# Patient Record
Sex: Male | Born: 1985 | Race: Black or African American | Hispanic: No | Marital: Single | Smoking: Current every day smoker
Health system: Southern US, Community
[De-identification: ages and names within clinical notes are randomized; demographics above are authoritative.]

## PROBLEM LIST (undated history)

## (undated) DIAGNOSIS — B009 Herpesviral infection, unspecified: Secondary | ICD-10-CM

## (undated) HISTORY — PX: RHINOPLASTY: SUR1284

---

## 2020-03-05 ENCOUNTER — Ambulatory Visit (INDEPENDENT_AMBULATORY_CARE_PROVIDER_SITE_OTHER): Payer: Self-pay | Admitting: Pharmacist

## 2020-03-05 ENCOUNTER — Telehealth: Payer: Self-pay | Admitting: Pharmacy Technician

## 2020-03-05 ENCOUNTER — Other Ambulatory Visit: Payer: Self-pay

## 2020-03-05 DIAGNOSIS — Z7252 High risk homosexual behavior: Secondary | ICD-10-CM

## 2020-03-05 DIAGNOSIS — Z113 Encounter for screening for infections with a predominantly sexual mode of transmission: Secondary | ICD-10-CM

## 2020-03-05 NOTE — Progress Notes (Signed)
Date:  03/05/2020   HPI: Ian Humphrey is a 34 y.o. male who presents to the [facility] to discuss and initiate PrEP.  Insured   []    Uninsured  [x]    There are no problems to display for this patient.   Patient's Medications   No medications on file    Allergies: Not on File  Past Medical History: No past medical history on file.  Social History: Social History   Socioeconomic History  . Marital status: Single    Spouse name: Not on file  . Number of children: Not on file  . Years of education: Not on file  . Highest education level: Not on file  Occupational History  . Not on file  Tobacco Use  . Smoking status: Not on file  Substance and Sexual Activity  . Alcohol use: Not on file  . Drug use: Not on file  . Sexual activity: Not on file  Other Topics Concern  . Not on file  Social History Narrative  . Not on file   Social Determinants of Health   Financial Resource Strain:   . Difficulty of Paying Living Expenses:   Food Insecurity:   . Worried About Charity fundraiser in the Last Year:   . Arboriculturist in the Last Year:   Transportation Needs:   . Film/video editor (Medical):   Marland Kitchen Lack of Transportation (Non-Medical):   Physical Activity:   . Days of Exercise per Week:   . Minutes of Exercise per Session:   Stress:   . Feeling of Stress :   Social Connections:   . Frequency of Communication with Friends and Family:   . Frequency of Social Gatherings with Friends and Family:   . Attends Religious Services:   . Active Member of Clubs or Organizations:   . Attends Archivist Meetings:   Marland Kitchen Marital Status:     CHL HIV PREP FLOWSHEET RESULTS 03/05/2020  Insurance Status Uninsured  Gender at birth Male  Gender identity cis-Male  Sex Partners Men only  # sex partners past 3-6 mos 1  Sex activity preferences Insertive and receptive  Condom use No  Treated for STI? Yes  HIV symptoms? Flu-like/Mono-like symptoms  PrEP  Eligibility Yes    Labs:  SCr: No results found for: CREATININE HIV No results found for: HIV Hepatitis B No results found for: HEPBSAB, HEPBSAG, HEPBCAB Hepatitis C No results found for: HEPCAB, HCVRNAPCRQN Hepatitis A No results found for: HAV RPR and STI No results found for: LABRPR, RPRTITER  No flowsheet data found.  Assessment: Ian Humphrey presents to clinic this afternoon as a new PrEP patient. He knows PrEP is used to help against contracting the HIV virus, and decided to inquire about it since his husband is HIV positive. He reports they are in a monogamous relationship and he thinks his husband is undetectable the majority of the time, but there are spells where his HIV medication causes him issues and he stops taking the medication. He does not receive care here, [facility] We spoke about U=U and I encouraged him that PrEP would only increase his chances of preventing HIV. Him and his partner do not use protection and he is a versatile partner. They do not have oral sex. He would like to be tested for STDs at this visit.  I told him that there are two medications that we use for PrEP, Descovy  and Truvada. They both are effective at preventing HIV, but we will be prescribing him Descovy. Descovy does not cause as much kidney and bone toxicity as Truvada does, which is why we favor it in men. This is a one pill once a day medication that is generally very well tolerated. He can take it with or without food. Adherence is the most important factor in whether this medication will work or not. He understands this and seems motivated to be adherent.   He did describe some flu-like symptoms about a month ago, and he has had a persistent headache almost everyday. These symptoms are pretty non-specific but sometimes a new HIV infection can present this way. We will check the HIV antibody before starting. We talked about why it is important we check that  lab on follow up visits. Descovy would not be enough to treat an HIV infection and would likely cause him to develop a resistant/harder to treat infection if he were to take this medication with an active HIV infection.   He has no other questions at this time. He is approved for his first 30 days of Descovy which [facility] [address] in [place] if his HIV Ab is negative.     Plan: - BMET, HIV Ab, Hepatitis A,B,C, RPR, Urine/oral/rectal cytologies - Descovy if HIV Ab negative - F/u with Ian Humphrey 04/08/2020  Jettie Pagan, PharmD PGY2 Infectious Disease Pharmacy Resident  [facility] 03/05/2020, 3:41 PM

## 2020-03-05 NOTE — Telephone Encounter (Addendum)
[facility] Patient Advocate Encounter  Completed and sent [employer] Advancing Access application for Descovy for this patient who is uninsured.    Patient is approved 03/05/2020 through 09/20/2020.      Netty Starring. Dimas Aguas CPhT Specialty Pharmacy Patient [facility] Phone: [phone] Fax:  [phone]

## 2020-03-05 NOTE — Telephone Encounter (Signed)
[facility] Patient Advocate Encounter ° °Insurance verification completed.   ° °The patient is uninsured and will need patient assistance for medication. ° °We can complete the application and will need to meet with the patient for signatures and income documentation. ° °Levii Hairfield E. Durene Dodge, CPhT °Specialty Pharmacy Patient Advocate °[facility] °Phone: [phone] °Fax:  [phone] ° ° °

## 2020-03-06 ENCOUNTER — Other Ambulatory Visit: Payer: Self-pay | Admitting: Pharmacist

## 2020-03-06 DIAGNOSIS — Z7252 High risk homosexual behavior: Secondary | ICD-10-CM

## 2020-03-06 LAB — URINE CYTOLOGY ANCILLARY ONLY
Chlamydia: NEGATIVE
Comment: NEGATIVE
Comment: NORMAL
Neisseria Gonorrhea: NEGATIVE

## 2020-03-06 LAB — CYTOLOGY, (ORAL, ANAL, URETHRAL) ANCILLARY ONLY
Chlamydia: NEGATIVE
Comment: NEGATIVE
Comment: NORMAL
Neisseria Gonorrhea: NEGATIVE

## 2020-03-06 LAB — BASIC METABOLIC PANEL
BUN: 10 mg/dL (ref 7–25)
CO2: 29 mmol/L (ref 20–32)
Calcium: 9.8 mg/dL (ref 8.6–10.3)
Chloride: 102 mmol/L (ref 98–110)
Creat: 0.95 mg/dL (ref 0.60–1.35)
Glucose, Bld: 82 mg/dL (ref 65–99)
Potassium: 4.2 mmol/L (ref 3.5–5.3)
Sodium: 138 mmol/L (ref 135–146)

## 2020-03-06 LAB — HEPATITIS A ANTIBODY, TOTAL: Hepatitis A AB,Total: NONREACTIVE

## 2020-03-06 LAB — HEPATITIS C ANTIBODY
Hepatitis C Ab: NONREACTIVE
SIGNAL TO CUT-OFF: 0.01 (ref <1.00)

## 2020-03-06 LAB — HEPATITIS B SURFACE ANTIGEN: Hepatitis B Surface Ag: NONREACTIVE

## 2020-03-06 LAB — HIV ANTIBODY (ROUTINE TESTING W REFLEX): HIV 1&2 Ab, 4th Generation: NONREACTIVE

## 2020-03-06 LAB — RPR: RPR Ser Ql: NONREACTIVE

## 2020-03-06 LAB — HEPATITIS B SURFACE ANTIBODY,QUALITATIVE: Hep B S Ab: REACTIVE — AB

## 2020-03-06 MED ORDER — EMTRICITABINE-TENOFOVIR AF 200-25 MG PO TABS: 1.0000 | ORAL_TABLET | Freq: Every day | ORAL | 0 refills | Status: DC

## 2020-03-06 NOTE — Progress Notes (Unsigned)
Patients HIV Ab was negative, [facility] [address]

## 2020-03-13 NOTE — Addendum Note (Signed)
Addended by: Robinette Haines on: 03/13/2020 10:46 AM   Modules accepted: Level of Service

## 2020-04-08 ENCOUNTER — Ambulatory Visit (INDEPENDENT_AMBULATORY_CARE_PROVIDER_SITE_OTHER): Payer: Self-pay | Admitting: Pharmacist

## 2020-04-08 ENCOUNTER — Other Ambulatory Visit: Payer: Self-pay

## 2020-04-08 DIAGNOSIS — Z79899 Other long term (current) drug therapy: Secondary | ICD-10-CM

## 2020-04-08 DIAGNOSIS — Z7252 High risk homosexual behavior: Secondary | ICD-10-CM

## 2020-04-08 NOTE — Progress Notes (Signed)
Date:  04/08/2020   HPI: Dyshon Philbin is a 34 y.o. male who presents to the [facility] clinic for 3 month PrEP follow-up.  Insured   []    Uninsured  [x]    There are no problems to display for this patient.   Patient's Medications  New Prescriptions   No medications on file  Previous Medications   EMTRICITABINE-TENOFOVIR AF (DESCOVY) 200-25 MG TABLET    Take 1 tablet by mouth daily.  Modified Medications   No medications on file  Discontinued Medications   No medications on file    Allergies: Not on File  Past Medical History: No past medical history on file.  Social History: Social History   Socioeconomic History  . Marital status: Single    Spouse name: Not on file  . Number of children: Not on file  . Years of education: Not on file  . Highest education level: Not on file  Occupational History  . Not on file  Tobacco Use  . Smoking status: Not on file  Substance and Sexual Activity  . Alcohol use: Not on file  . Drug use: Not on file  . Sexual activity: Not on file  Other Topics Concern  . Not on file  Social History Narrative  . Not on file   Social Determinants of Health   Financial Resource Strain:   . Difficulty of Paying Living Expenses:   Food Insecurity:   . Worried About in the Last Year:   . in the Last Year:   Transportation Needs:   . Programme researcher, broadcasting/film/video (Medical):   Barista Lack of Transportation (Non-Medical):   Physical Activity:   . Days of Exercise per Week:   . Minutes of Exercise per Session:   Stress:   . Feeling of Stress :   Social Connections:   . Frequency of Communication with Friends and Family:   . Frequency of Social Gatherings with Friends and Family:   . Attends Religious Services:   . Active Member of Clubs or Organizations:   . Attends Freight forwarder Meetings:   Marland Kitchen Marital Status:     CHL HIV PREP FLOWSHEET RESULTS 03/05/2020  Insurance Status Uninsured  Gender at  birth Male  Gender identity cis-Male  Sex Partners Men only  # sex partners past 3-6 mos 1  Sex activity preferences Insertive and receptive  Condom use No  Treated for STI? Yes  HIV symptoms? Flu-like/Mono-like symptoms  PrEP Eligibility Yes    Labs:  SCr: Lab Results  Component Value Date   CREATININE 0.95 03/05/2020   HIV Lab Results  Component Value Date   HIV NON-REACTIVE 03/05/2020   Hepatitis B Lab Results  Component Value Date   HEPBSAB REACTIVE (A) 03/05/2020   HEPBSAG NON-REACTIVE 03/05/2020   Hepatitis C Lab Results  Component Value Date   HEPCAB NON-REACTIVE 03/05/2020   Hepatitis A Lab Results  Component Value Date   HAV NON-REACTIVE 03/05/2020   RPR and STI Lab Results  Component Value Date   LABRPR NON-REACTIVE 03/05/2020    STI Results GC CT  03/05/2020 Negative Negative  03/05/2020 Negative Negative    Assessment: Jamespaul is here today for his 1 month PrEP follow up appoimtment. He saw 03/07/2020 last month and was initiated on Descovy. His partner is HIV + on medication. He has a few tablets left of his first month of Descovy and is doing well on it so far. He  had some nausea when he first started but it resolved after a couple of days. He has missed no doses.  We got him approved for [employer] patient assistance until the end of the year.  Will check HIV and a BMET today and see him back in 3 months.  Plan: - HIV antibody + BMET today - Descovy x 3 months if HIV negative - F/u in 3 months  Alpheus Stiff L. Reet Scharrer, PharmD, BCIDP, AAHIVP, CPP Clinical Pharmacist Practitioner Infectious Diseases Clinical Pharmacist [facility] 04/08/2020, 4:13 PM

## 2020-04-09 LAB — BASIC METABOLIC PANEL
BUN: 11 mg/dL (ref 7–25)
CO2: 29 mmol/L (ref 20–32)
Calcium: 9.5 mg/dL (ref 8.6–10.3)
Chloride: 103 mmol/L (ref 98–110)
Creat: 1.1 mg/dL (ref 0.60–1.35)
Glucose, Bld: 94 mg/dL (ref 65–99)
Potassium: 4.5 mmol/L (ref 3.5–5.3)
Sodium: 139 mmol/L (ref 135–146)

## 2020-04-09 LAB — HIV ANTIBODY (ROUTINE TESTING W REFLEX): HIV 1&2 Ab, 4th Generation: NONREACTIVE

## 2020-04-10 ENCOUNTER — Other Ambulatory Visit: Payer: Self-pay | Admitting: Pharmacist

## 2020-04-10 ENCOUNTER — Encounter: Payer: Self-pay | Admitting: Pharmacist

## 2020-04-10 DIAGNOSIS — Z7252 High risk homosexual behavior: Secondary | ICD-10-CM

## 2020-04-10 MED ORDER — EMTRICITABINE-TENOFOVIR AF 200-25 MG PO TABS: 1.0000 | ORAL_TABLET | Freq: Every day | ORAL | 2 refills | Status: DC

## 2020-04-10 NOTE — Progress Notes (Signed)
Patient's HIV antibody is negative.  [facility]  

## 2020-07-02 ENCOUNTER — Other Ambulatory Visit: Payer: Self-pay

## 2020-07-02 ENCOUNTER — Ambulatory Visit (INDEPENDENT_AMBULATORY_CARE_PROVIDER_SITE_OTHER): Payer: Self-pay | Admitting: Pharmacist

## 2020-07-02 DIAGNOSIS — Z7252 High risk homosexual behavior: Secondary | ICD-10-CM

## 2020-07-02 NOTE — Progress Notes (Signed)
Date:  07/02/2020   HPI: Ian Humphrey is a 34 y.o. male who presents to the [facility] for HIV PrEP follow-up.  Insured   []    Uninsured  [x]    There are no problems to display for this patient.   Patient's Medications  New Prescriptions   No medications on file  Previous Medications   EMTRICITABINE-TENOFOVIR AF (DESCOVY) 200-25 MG TABLET    Take 1 tablet by mouth daily.  Modified Medications   No medications on file  Discontinued Medications   No medications on file    Allergies: Not on File  Past Medical History: No past medical history on file.  Social History: Social History   Socioeconomic History  . Marital status: Single    Spouse name: Not on file  . Number of children: Not on file  . Years of education: Not on file  . Highest education level: Not on file  Occupational History  . Not on file  Tobacco Use  . Smoking status: Not on file  Substance and Sexual Activity  . Alcohol use: Not on file  . Drug use: Not on file  . Sexual activity: Not on file  Other Topics Concern  . Not on file  Social History Narrative  . Not on file   Social Determinants of Health   Financial Resource Strain:   . Difficulty of Paying Living Expenses: Not on file  Food Insecurity:   . Worried About in the Last Year: Not on file  . Ran Out of Food in the Last Year: Not on file  Transportation Needs:   . Lack of Transportation (Medical): Not on file  . Lack of Transportation (Non-Medical): Not on file  Physical Activity:   . Days of Exercise per Week: Not on file  . Minutes of Exercise per Session: Not on file  Stress:   . Feeling of Stress : Not on file  Social Connections:   . Frequency of Communication with Friends and Family: Not on file  . Frequency of Social Gatherings with Friends and Family: Not on file  . Attends Religious Services: Not on file  . Active Member of Clubs or Organizations: Not on file  . Attends Meetings: Not on file  . Marital Status: Not on file    CHL HIV PREP FLOWSHEET RESULTS 03/05/2020  Insurance Status Uninsured  Gender at birth Male  Gender identity cis-Male  Sex Partners Men only  # sex partners past 3-6 mos 1  Sex activity preferences Insertive and receptive  Condom use No  Treated for STI? Yes  HIV symptoms? Flu-like/Mono-like symptoms  PrEP Eligibility Yes    Labs:  SCr: Lab Results  Component Value Date   CREATININE 1.10 04/08/2020   CREATININE 0.95 03/05/2020   HIV Lab Results  Component Value Date   HIV NON-REACTIVE 04/08/2020   HIV NON-REACTIVE 03/05/2020   Hepatitis B Lab Results  Component Value Date   HEPBSAB REACTIVE (A) 03/05/2020   HEPBSAG NON-REACTIVE 03/05/2020   Hepatitis C Lab Results  Component Value Date   HEPCAB NON-REACTIVE 03/05/2020   Hepatitis A Lab Results  Component Value Date   HAV NON-REACTIVE 03/05/2020   RPR and STI Lab Results  Component Value Date   LABRPR NON-REACTIVE 03/05/2020    STI Results GC CT  03/05/2020 Negative Negative  03/05/2020 Negative Negative    Assessment: Ian Humphrey presents today for his 50-month PrEP follow-up. He is taking Descovy daily (  no missed doses) and is tolerating well. Will check HIV ab today. If negative, will refill his Descovy. Will follow up with him in 3 months. Patient has received his COVID-19 Pfizer shots; he hasn't received his flu shot yet but did not want it today.  He reports being exposed to gonorrhea with a new partner and now has urinary symptoms including burning and pain. He stated this was a "one time thing", and he is still with his long-term partner. He has not been tested for STIs since exposure, so we recommended calling the health department to receive free testing and treatment since he is uninsured. We informed him that we could test him here for free but would have to charge him for his STI treatment at [facility]. He stated he would call the health  department tomorrow morning.  Plan: Check HIV ab If HIV ab negative, refill Descovy Follow-up with Cassie on 10/02/20  Recommend calling/going to health dept for STI testing/treatment  Margarite Gouge, PharmD PGY2 ID Pharmacy Resident [phone]  07/02/2020, 3:15 PM

## 2020-07-03 ENCOUNTER — Other Ambulatory Visit: Payer: Self-pay | Admitting: Pharmacist

## 2020-07-03 DIAGNOSIS — Z7252 High risk homosexual behavior: Secondary | ICD-10-CM

## 2020-07-03 LAB — HIV ANTIBODY (ROUTINE TESTING W REFLEX): HIV 1&2 Ab, 4th Generation: NONREACTIVE

## 2020-07-03 MED ORDER — EMTRICITABINE-TENOFOVIR AF 200-25 MG PO TABS: 1.0000 | ORAL_TABLET | Freq: Every day | ORAL | 2 refills | Status: DC

## 2020-07-03 NOTE — Progress Notes (Signed)
Patient's HIV antibody is negative.  [facility]  

## 2020-07-08 ENCOUNTER — Ambulatory Visit: Payer: Self-pay | Admitting: Pharmacist

## 2020-10-02 ENCOUNTER — Other Ambulatory Visit: Payer: Self-pay

## 2020-10-02 ENCOUNTER — Ambulatory Visit: Payer: Self-pay | Admitting: Pharmacist

## 2020-10-02 DIAGNOSIS — Z7252 High risk homosexual behavior: Secondary | ICD-10-CM

## 2020-10-02 DIAGNOSIS — Z113 Encounter for screening for infections with a predominantly sexual mode of transmission: Secondary | ICD-10-CM

## 2020-10-02 DIAGNOSIS — Z79899 Other long term (current) drug therapy: Secondary | ICD-10-CM

## 2020-10-02 MED ORDER — EMTRICITABINE-TENOFOVIR AF 200-25 MG PO TABS: 1.0000 | ORAL_TABLET | Freq: Every day | ORAL | 2 refills | Status: DC

## 2020-10-02 NOTE — Progress Notes (Signed)
   Date:  10/02/2020   HPI: Ian Humphrey is a 35 y.o. male who presents to the [facility] clinic for HIV PrEP follow-up.  Insured   []    Uninsured  [x]    There are no problems to display for this patient.   Patient's Medications  New Prescriptions   No medications on file  Previous Medications   EMTRICITABINE-TENOFOVIR AF (DESCOVY) 200-25 MG TABLET    Take 1 tablet by mouth daily.  Modified Medications   No medications on file  Discontinued Medications   No medications on file    Allergies: Not on File  Past Medical History: No past medical history on file.  Social History: Social History   Socioeconomic History  . Marital status: Single    Spouse name: Not on file  . Number of children: Not on file  . Years of education: Not on file  . Highest education level: Not on file  Occupational History  . Not on file  Tobacco Use  . Smoking status: Not on file  . Smokeless tobacco: Not on file  Substance and Sexual Activity  . Alcohol use: Not on file  . Drug use: Not on file  . Sexual activity: Not on file  Other Topics Concern  . Not on file  Social History Narrative  . Not on file   Social Determinants of Health   Financial Resource Strain: Not on file  Food Insecurity: Not on file  Transportation Needs: Not on file  Physical Activity: Not on file  Stress: Not on file  Social Connections: Not on file    CHL HIV PREP FLOWSHEET RESULTS 03/05/2020  Insurance Status Uninsured  Gender at birth Male  Gender identity cis-Male  Sex Partners Men only  # sex partners past 3-6 mos 1  Sex activity preferences Insertive and receptive  Condom use No  Treated for STI? Yes  HIV symptoms? Flu-like/Mono-like symptoms  PrEP Eligibility Yes    Labs:  SCr: Lab Results  Component Value Date   CREATININE 1.10 04/08/2020   CREATININE 0.95 03/05/2020   HIV Lab Results  Component Value Date   HIV NON-REACTIVE 07/02/2020   HIV NON-REACTIVE 04/08/2020   HIV  NON-REACTIVE 03/05/2020   Hepatitis B Lab Results  Component Value Date   HEPBSAB REACTIVE (A) 03/05/2020   HEPBSAG NON-REACTIVE 03/05/2020   Hepatitis C Lab Results  Component Value Date   HEPCAB NON-REACTIVE 03/05/2020   Hepatitis A Lab Results  Component Value Date   HAV NON-REACTIVE 03/05/2020   RPR and STI Lab Results  Component Value Date   LABRPR NON-REACTIVE 03/05/2020    STI Results GC CT  03/05/2020 Negative Negative  03/05/2020 Negative Negative    Assessment: Ian Humphrey is here today to follow up for PrEP. He takes Descovy every day and has no issues or missed doses. He was exposed to gonorrhea at his last visit and was treated at urgent care. No signs or symptoms of anything today, but he wishes to get screened again. Will check labs, have him come back in 3 months for labs, and then see me again in July.  Plan: - HIV antibody, BMET, urine cytology today - Descovy x 6 months if HIV negative - F/u for labs 4/7 at 4pm - F/u with me 7/12 at 4pm  Sallie Staron L. Cataldo Cosgriff, PharmD, BCIDP, AAHIVP, CPP Clinical Pharmacist Practitioner Infectious Diseases Clinical Pharmacist [facility] 10/02/2020, 3:59 PM

## 2020-10-03 ENCOUNTER — Encounter: Payer: Self-pay | Admitting: Pharmacist

## 2020-10-03 LAB — BASIC METABOLIC PANEL
BUN: 9 mg/dL (ref 7–25)
CO2: 27 mmol/L (ref 20–32)
Calcium: 9.3 mg/dL (ref 8.6–10.3)
Chloride: 102 mmol/L (ref 98–110)
Creat: 0.98 mg/dL (ref 0.60–1.35)
Glucose, Bld: 90 mg/dL (ref 65–99)
Potassium: 4 mmol/L (ref 3.5–5.3)
Sodium: 139 mmol/L (ref 135–146)

## 2020-10-03 LAB — HIV ANTIBODY (ROUTINE TESTING W REFLEX): HIV 1&2 Ab, 4th Generation: NONREACTIVE

## 2020-10-03 MED ORDER — EMTRICITABINE-TENOFOVIR AF 200-25 MG PO TABS: 1.0000 | ORAL_TABLET | Freq: Every day | ORAL | 5 refills | Status: DC

## 2020-10-04 ENCOUNTER — Other Ambulatory Visit (HOSPITAL_COMMUNITY): Payer: Self-pay | Admitting: Pharmacist

## 2020-10-04 ENCOUNTER — Telehealth: Payer: Self-pay

## 2020-10-04 LAB — URINE CYTOLOGY ANCILLARY ONLY
Chlamydia: NEGATIVE
Comment: NEGATIVE
Comment: NORMAL
Neisseria Gonorrhea: NEGATIVE

## 2020-10-04 NOTE — Telephone Encounter (Signed)
Can you look into this when you have a moment please? Thanks!

## 2020-10-04 NOTE — Telephone Encounter (Signed)
[facility] Patient Advocate Encounter  Completed and sent [employer] Advancing Access application for Descovy for this patient who is uninsured.    Patient assistance phone number for follow up is [phone].   This encounter will be updated until final determination.,   Deziray Nabi, CPhT Specialty Pharmacy Patient Advocate [facility] Phone: [phone] Fax:  [phone]  

## 2020-10-08 ENCOUNTER — Telehealth: Payer: Self-pay

## 2020-10-08 MED FILL — DESCOVY 200-25 MG TABS: 200-25 | 30 days supply | Qty: 30 | Fill #0

## 2020-10-08 NOTE — Telephone Encounter (Signed)
[facility] Patient Advocate Encounter  Completed and sent [employer] Advancing Access application for Descovy for this patient who is uninsured.    Patient is approved 10/08/20 through 10/08/21.  BIN      [id number] PCN    [id number] [id number]    [id number] ID        [id number]   Clearance Coots, CPhT Specialty Pharmacy Patient [facility] Phone: [phone] Fax:  [phone]

## 2020-12-17 ENCOUNTER — Other Ambulatory Visit (HOSPITAL_COMMUNITY): Payer: Self-pay

## 2020-12-26 ENCOUNTER — Other Ambulatory Visit: Payer: Self-pay | Admitting: Pharmacist

## 2020-12-26 ENCOUNTER — Other Ambulatory Visit: Payer: Self-pay

## 2020-12-26 DIAGNOSIS — Z7252 High risk homosexual behavior: Secondary | ICD-10-CM

## 2020-12-27 LAB — HIV ANTIBODY (ROUTINE TESTING W REFLEX): HIV 1&2 Ab, 4th Generation: NONREACTIVE

## 2021-01-02 ENCOUNTER — Other Ambulatory Visit (HOSPITAL_COMMUNITY): Payer: Self-pay

## 2021-01-09 ENCOUNTER — Encounter: Payer: Self-pay | Admitting: Pharmacist

## 2021-03-11 ENCOUNTER — Other Ambulatory Visit (HOSPITAL_COMMUNITY): Payer: Self-pay

## 2021-03-28 ENCOUNTER — Other Ambulatory Visit: Payer: Self-pay

## 2021-03-28 ENCOUNTER — Ambulatory Visit (HOSPITAL_COMMUNITY)
Admission: EM | Admit: 2021-03-28 | Discharge: 2021-03-28 | Disposition: A | Discharge status: Home / Self Care | Payer: Self-pay | Attending: Urgent Care | Admitting: Urgent Care

## 2021-03-28 ENCOUNTER — Ambulatory Visit (INDEPENDENT_AMBULATORY_CARE_PROVIDER_SITE_OTHER): Payer: Self-pay

## 2021-03-28 ENCOUNTER — Encounter (HOSPITAL_COMMUNITY): Payer: Self-pay

## 2021-03-28 DIAGNOSIS — M79642 Pain in left hand: Secondary | ICD-10-CM

## 2021-03-28 DIAGNOSIS — S60222A Contusion of left hand, initial encounter: Secondary | ICD-10-CM

## 2021-03-28 NOTE — ED Triage Notes (Signed)
Pt presents with left hand injury after fall X 7 days ; pt presents with pain & swelling.

## 2021-03-28 NOTE — ED Provider Notes (Signed)
[facility]   MRN: [id number] DOB: 1986/06/19  Subjective:   Ian Humphrey is a 35 y.o. male presenting for 1 week history of persistent left sided hand, thumb pain.  On patient was at a pool, slipped and fell absorbing the impact of the fall on the left side of his hand/wrist.  Has been using a brace/wrap.  No medications.  Has had some swelling.  Denies bruising, bony deformity.  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  No current facility-administered medications for this encounter.  Current Outpatient Medications:    emtricitabine-tenofovir AF (DESCOVY) 200-25 MG tablet, Take 1 tablet by mouth daily., Disp: 30 tablet, Rfl: 5   emtricitabine-tenofovir AF (DESCOVY) 200-25 MG tablet, TAKE 1 TABLET BY MOUTH DAILY, Disp: 30 tablet, Rfl: 0   No Known Allergies  History reviewed. No pertinent past medical history.   History reviewed. No pertinent surgical history.  Family History  Family history unknown: Yes    Social History   Tobacco Use   Smoking status: Every Day    Pack years: 0.00    Types: Cigarettes    ROS   Objective:   Vitals: BP (!) 157/105 (BP Location: Right Arm)   Pulse 65   Temp 98.4 F (36.9 C) (Oral)   Resp 18   SpO2 95%   Physical Exam Constitutional:      General: He is not in acute distress.    Appearance: Normal appearance. He is well-developed and normal weight. He is not ill-appearing, toxic-appearing or diaphoretic.  HENT:     Head: Normocephalic and atraumatic.     Right Ear: External ear normal.     Left Ear: External ear normal.     Nose: Nose normal.     Mouth/Throat:     Pharynx: Oropharynx is clear.  Eyes:     General: No scleral icterus.       Right eye: No discharge.        Left eye: No discharge.     Extraocular Movements: Extraocular movements intact.     Pupils: Pupils are equal, round, and reactive to light.  Cardiovascular:     Rate and Rhythm: Normal rate.  Pulmonary:     Effort:  Pulmonary effort is normal.  Musculoskeletal:     Left hand: Swelling (trace), tenderness and bony tenderness present. No deformity or lacerations. Normal range of motion. Normal strength. Normal sensation. Normal capillary refill.       Hands:     Cervical back: Normal range of motion.  Neurological:     Mental Status: He is alert and oriented to person, place, and time.  Psychiatric:        Mood and Affect: Mood normal.        Behavior: Behavior normal.        Thought Content: Thought content normal.        Judgment: Judgment normal.    DG Hand Complete Left  Result Date: 03/28/2021 CLINICAL DATA:  Left hand pain, injury EXAM: LEFT HAND - COMPLETE 3+ VIEW COMPARISON:  None. FINDINGS: There is no evidence of fracture or dislocation. There is no evidence of arthropathy or other focal bone abnormality. IMPRESSION: No acute osseous abnormality. Electronically Signed   By: Caprice Renshaw   On: 03/28/2021 15:10     Assessment and Plan :   PDMP not reviewed this encounter.  1. Left hand pain   2. Contusion of left hand, initial encounter     Recommended  rice method, used 2 inch Ace wrap across his hand.  Tylenol for pain control given that he is on Descovy for PrEP and does not have HIV. Counseled patient on potential for adverse effects with medications prescribed/recommended today, ER and return-to-clinic precautions discussed, patient verbalized understanding.    Wallis Bamberg, [place] 03/28/21 1546

## 2021-03-28 NOTE — Discharge Instructions (Addendum)
Please just use Tylenol at a dose of 500mg-650mg once every 6 hours as needed for your aches, pains, fevers. Do not use any nonsteroidal anti-inflammatories (NSAIDs) like ibuprofen, Motrin, naproxen, Aleve, etc. which are all available over-the-counter.   

## 2021-04-01 ENCOUNTER — Ambulatory Visit: Payer: Self-pay | Admitting: Pharmacist

## 2021-04-03 ENCOUNTER — Other Ambulatory Visit: Payer: Self-pay

## 2021-04-03 ENCOUNTER — Other Ambulatory Visit (INDEPENDENT_AMBULATORY_CARE_PROVIDER_SITE_OTHER): Payer: Self-pay | Admitting: Pharmacist

## 2021-04-03 DIAGNOSIS — Z113 Encounter for screening for infections with a predominantly sexual mode of transmission: Secondary | ICD-10-CM

## 2021-04-03 DIAGNOSIS — Z79899 Other long term (current) drug therapy: Secondary | ICD-10-CM

## 2021-04-03 DIAGNOSIS — Z7252 High risk homosexual behavior: Secondary | ICD-10-CM

## 2021-04-03 MED ORDER — EMTRICITABINE-TENOFOVIR AF 200-25 MG PO TABS: 1.0000 | ORAL_TABLET | Freq: Every day | ORAL | 0 refills | Status: DC

## 2021-04-03 NOTE — Progress Notes (Signed)
Date:  04/03/2021   HPI: Ian Humphrey is a 35 y.o. male who presents to the [facility] pharmacy clinic for HIV PrEP follow-up.  Insured   []    Uninsured  []    There are no problems to display for this patient.   Patient's Medications  New Prescriptions   No medications on file  Previous Medications   EMTRICITABINE-TENOFOVIR AF (DESCOVY) 200-25 MG TABLET    Take 1 tablet by mouth daily.   EMTRICITABINE-TENOFOVIR AF (DESCOVY) 200-25 MG TABLET    TAKE 1 TABLET BY MOUTH DAILY  Modified Medications   No medications on file  Discontinued Medications   No medications on file    Allergies: No Known Allergies  Past Medical History: No past medical history on file.  Social History: Social History   Socioeconomic History   Marital status: Single    Spouse name: Not on file   Number of children: Not on file   Years of education: Not on file   Highest education level: Not on file  Occupational History   Not on file  Tobacco Use   Smoking status: Every Day    Types: Cigarettes   Smokeless tobacco: Not on file  Substance and Sexual Activity   Alcohol use: Not on file   Drug use: Not on file   Sexual activity: Not on file  Other Topics Concern   Not on file  Social History Narrative   Not on file   Social Determinants of Health   Financial Resource Strain: Not on file  Food Insecurity: Not on file  Transportation Needs: Not on file  Physical Activity: Not on file  Stress: Not on file  Social Connections: Not on file    CHL HIV PREP FLOWSHEET RESULTS 03/05/2020  Insurance Status Uninsured  Gender at birth Male  Gender identity cis-Male  Sex Partners Men only  # sex partners past 3-6 mos 1  Sex activity preferences Insertive and receptive  Condom use No  Treated for STI? Yes  HIV symptoms? Flu-like/Mono-like symptoms  PrEP Eligibility Yes    Labs:  SCr: Lab Results  Component Value Date   CREATININE 0.98 10/02/2020   CREATININE 1.10 04/08/2020   CREATININE  0.95 03/05/2020   HIV Lab Results  Component Value Date   HIV NON-REACTIVE 12/26/2020   HIV NON-REACTIVE 10/02/2020   HIV NON-REACTIVE 07/02/2020   HIV NON-REACTIVE 04/08/2020   HIV NON-REACTIVE 03/05/2020   Hepatitis B Lab Results  Component Value Date   HEPBSAB REACTIVE (A) 03/05/2020   HEPBSAG NON-REACTIVE 03/05/2020   Hepatitis C Lab Results  Component Value Date   HEPCAB NON-REACTIVE 03/05/2020   Hepatitis A Lab Results  Component Value Date   HAV NON-REACTIVE 03/05/2020   RPR and STI Lab Results  Component Value Date   LABRPR NON-REACTIVE 03/05/2020    STI Results GC CT  10/02/2020 Negative Negative  03/05/2020 Negative Negative  03/05/2020 Negative Negative    Assessment: Ian Humphrey is here for HIV PrEP follow up. He continues to take Descovy through patient assistance. He takes it every day without missing doses. He has a few pills left. Screened for acute HIV symptoms such as fatigue, muscle aches, rash, sore throat, lymphadenopathy, headache, night sweats, nausea/vomiting/diarrhea, and fever. Denies any symptoms. Will screen for HIV and STIs today.   He is interested in Apretude but would like to research it a bit before starting. 03/07/2020 had him sign the patient assistance application just in case he decides to proceed. He will send  me a Clinical cytogeneticist message when he decides. Will send in a 30 day supply of Descovy for him to take in the meantime.  Plan: - HIV antibody, urine/rectal STI swabs, RPR, HIV RNA today - Descovy x 30 days if HIV negative - F/u with me regarding Apretude injections  Cheral Cappucci L. Shylyn Younce, PharmD, BCIDP, AAHIVP, CPP Clinical Pharmacist Practitioner Infectious Diseases Clinical Pharmacist [facility] 04/03/2021, 2:41 PM

## 2021-04-04 LAB — CYTOLOGY, (ORAL, ANAL, URETHRAL) ANCILLARY ONLY
Chlamydia: NEGATIVE
Comment: NEGATIVE
Comment: NORMAL
Neisseria Gonorrhea: NEGATIVE

## 2021-04-04 LAB — URINE CYTOLOGY ANCILLARY ONLY
Chlamydia: NEGATIVE
Comment: NEGATIVE
Comment: NORMAL
Neisseria Gonorrhea: NEGATIVE

## 2021-04-07 LAB — HIV-1 RNA QUANT-NO REFLEX-BLD
HIV 1 RNA Quant: NOT DETECTED Copies/mL
HIV-1 RNA Quant, Log: NOT DETECTED Log cps/mL

## 2021-04-07 LAB — HIV ANTIBODY (ROUTINE TESTING W REFLEX): HIV 1&2 Ab, 4th Generation: NONREACTIVE

## 2021-04-07 LAB — RPR: RPR Ser Ql: NONREACTIVE

## 2021-04-21 NOTE — Progress Notes (Signed)
Date:  04/21/2021   HPI: Ian Humphrey is a 35 y.o. male who presents to the [facility] pharmacy clinic for HIV PrEP follow-up.  Insured   []    Uninsured  [x]    There are no problems to display for this patient.   Patient's Medications  New Prescriptions   No medications on file  Previous Medications   No medications on file  Modified Medications   Modified Medication Previous Medication   EMTRICITABINE-TENOFOVIR AF (DESCOVY) 200-25 MG TABLET emtricitabine-tenofovir AF (DESCOVY) 200-25 MG tablet      Take 1 tablet by mouth daily.    Take 1 tablet by mouth daily.  Discontinued Medications   EMTRICITABINE-TENOFOVIR AF (DESCOVY) 200-25 MG TABLET    TAKE 1 TABLET BY MOUTH DAILY    Allergies: No Known Allergies  Past Medical History: No past medical history on file.  Social History: Social History   Socioeconomic History   Marital status: Single    Spouse name: Not on file   Number of children: Not on file   Years of education: Not on file   Highest education level: Not on file  Occupational History   Not on file  Tobacco Use   Smoking status: Every Day    Types: Cigarettes   Smokeless tobacco: Not on file  Substance and Sexual Activity   Alcohol use: Not on file   Drug use: Not on file   Sexual activity: Not on file  Other Topics Concern   Not on file  Social History Narrative   Not on file   Social Determinants of Health   Financial Resource Strain: Not on file  Food Insecurity: Not on file  Transportation Needs: Not on file  Physical Activity: Not on file  Stress: Not on file  Social Connections: Not on file    CHL HIV PREP FLOWSHEET RESULTS 03/05/2020  Insurance Status Uninsured  Gender at birth Male  Gender identity cis-Male  Sex Partners Men only  # sex partners past 3-6 mos 1  Sex activity preferences Insertive and receptive  Condom use No  Treated for STI? Yes  HIV symptoms? Flu-like/Mono-like symptoms  PrEP Eligibility Yes     Labs:  SCr: Lab Results  Component Value Date   CREATININE 0.98 10/02/2020   CREATININE 1.10 04/08/2020   CREATININE 0.95 03/05/2020   HIV Lab Results  Component Value Date   HIV NON-REACTIVE 04/03/2021   HIV NON-REACTIVE 12/26/2020   HIV NON-REACTIVE 10/02/2020   HIV NON-REACTIVE 07/02/2020   HIV NON-REACTIVE 04/08/2020   Hepatitis B Lab Results  Component Value Date   HEPBSAB REACTIVE (A) 03/05/2020   HEPBSAG NON-REACTIVE 03/05/2020   Hepatitis C Lab Results  Component Value Date   HEPCAB NON-REACTIVE 03/05/2020   Hepatitis A Lab Results  Component Value Date   HAV NON-REACTIVE 03/05/2020   RPR and STI Lab Results  Component Value Date   LABRPR NON-REACTIVE 04/03/2021   LABRPR NON-REACTIVE 03/05/2020    STI Results GC CT  04/03/2021 Negative Negative  04/03/2021 Negative Negative  10/02/2020 Negative Negative  03/05/2020 Negative Negative  03/05/2020 Negative Negative    Assessment: Ian Humphrey is here for HIV PrEP follow up. He continues to take Descovy through patient assistance. He takes it every day without missing doses. He has a few pills left. Screened for acute HIV symptoms such as fatigue, muscle aches, rash, sore throat, lymphadenopathy, headache, night sweats, nausea/vomiting/diarrhea, and fever. Denies any symptoms. Will screen for HIV and STIs today.   He is interested  in Apretude but would like to research it a bit before starting. Ian Humphrey had him sign the patient assistance application just in case he decides to proceed. He will send me a mychart message when he decides. Will send in a 30 day supply of Descovy for him to take in the meantime.  Plan: - HIV antibody, urine/rectal STI swabs, RPR, HIV RNA today - Descovy x 30 days if HIV negative - F/u with me regarding Apretude injections  Kadeisha Betsch L. Joshalyn Ancheta, PharmD, BCIDP, AAHIVP, CPP Clinical Pharmacist Practitioner Infectious Diseases Clinical Pharmacist [facility] 04/21/2021, 12:05 PM

## 2021-06-04 ENCOUNTER — Other Ambulatory Visit: Payer: Self-pay

## 2021-06-04 ENCOUNTER — Ambulatory Visit (INDEPENDENT_AMBULATORY_CARE_PROVIDER_SITE_OTHER): Payer: Self-pay | Admitting: Pharmacist

## 2021-06-04 ENCOUNTER — Other Ambulatory Visit (HOSPITAL_COMMUNITY): Admission: RE | Admit: 2021-06-04 | Payer: Self-pay | Source: Ambulatory Visit | Admitting: Infectious Disease

## 2021-06-04 DIAGNOSIS — Z113 Encounter for screening for infections with a predominantly sexual mode of transmission: Secondary | ICD-10-CM

## 2021-06-04 DIAGNOSIS — Z202 Contact with and (suspected) exposure to infections with a predominantly sexual mode of transmission: Secondary | ICD-10-CM

## 2021-06-04 DIAGNOSIS — Z79899 Other long term (current) drug therapy: Secondary | ICD-10-CM

## 2021-06-04 MED ORDER — CEFTRIAXONE SODIUM 500 MG IJ SOLR
500.0000 mg | Freq: Once | INTRAMUSCULAR | Status: AC
  Administered 2021-06-04: 500 mg via INTRAMUSCULAR

## 2021-06-04 NOTE — Progress Notes (Signed)
Date:  06/04/2021   HPI: Ian Humphrey is a 35 y.o. male who presents to the [facility] pharmacy clinic for HIV PrEP follow-up.  Insured   []    Uninsured  [x]    There are no problems to display for this patient.   Patient's Medications  New Prescriptions   No medications on file  Previous Medications   EMTRICITABINE-TENOFOVIR AF (DESCOVY) 200-25 MG TABLET    Take 1 tablet by mouth daily.  Modified Medications   No medications on file  Discontinued Medications   No medications on file    Allergies: No Known Allergies  Past Medical History: No past medical history on file.  Social History: Social History   Socioeconomic History   Marital status: Single    Spouse name: Not on file   Number of children: Not on file   Years of education: Not on file   Highest education level: Not on file  Occupational History   Not on file  Tobacco Use   Smoking status: Every Day    Types: Cigarettes   Smokeless tobacco: Not on file  Substance and Sexual Activity   Alcohol use: Not on file   Drug use: Not on file   Sexual activity: Not on file  Other Topics Concern   Not on file  Social History Narrative   Not on file   Social Determinants of Health   Financial Resource Strain: Not on file  Food Insecurity: Not on file  Transportation Needs: Not on file  Physical Activity: Not on file  Stress: Not on file  Social Connections: Not on file    CHL HIV PREP FLOWSHEET RESULTS 03/05/2020  Insurance Status Uninsured  Gender at birth Male  Gender identity cis-Male  Sex Partners Men only  # sex partners past 3-6 mos 1  Sex activity preferences Insertive and receptive  Condom use No  Treated for STI? Yes  HIV symptoms? Flu-like/Mono-like symptoms  PrEP Eligibility Yes    Labs:  SCr: Lab Results  Component Value Date   CREATININE 0.98 10/02/2020   CREATININE 1.10 04/08/2020   CREATININE 0.95 03/05/2020   HIV Lab Results  Component Value Date   HIV NON-REACTIVE  04/03/2021   HIV NON-REACTIVE 12/26/2020   HIV NON-REACTIVE 10/02/2020   HIV NON-REACTIVE 07/02/2020   HIV NON-REACTIVE 04/08/2020   Hepatitis B Lab Results  Component Value Date   HEPBSAB REACTIVE (A) 03/05/2020   HEPBSAG NON-REACTIVE 03/05/2020   Hepatitis C Lab Results  Component Value Date   HEPCAB NON-REACTIVE 03/05/2020   Hepatitis A Lab Results  Component Value Date   HAV NON-REACTIVE 03/05/2020   RPR and STI Lab Results  Component Value Date   LABRPR NON-REACTIVE 04/03/2021   LABRPR NON-REACTIVE 03/05/2020    STI Results GC CT  04/03/2021 Negative Negative  04/03/2021 Negative Negative  10/02/2020 Negative Negative  03/05/2020 Negative Negative  03/05/2020 Negative Negative    Assessment: Patient is in a monogamous relationship and reports no other sexual partners. Patient's partner came in for treatment of gonorrhea yesterday after testing positive in [facility]. Patient states he does not have any discharge, but does state he has some dysuria. Will treat with ceftriaxone in clinic.   States he has been compliant with Descovy when he has it, but has been having trouble receiving the medication from the pharmacy. Discussed switching his prescription to the [facility] pharmacy and he voiced his consent. He also stated he prefers the tablets in the bottle as opposed to the  Dosepack. We also discussed the use of Apretude, and stated he wishes to continue PrEP therapy with Descovy at this time.   Discussed the monkeypox vaccine with the patient discussing how monkeypox is transmitted, and side effects of the vaccine as well as when he would achieve immunity. He stated he would discuss the vaccine with his partner and consider it in the future.   Plan: Ceftriaxone 500 mg IM x1 for treatment of Gonorrhea (administered during appointment)  F/U with PrEP pharmacy clinic in 3 months (09/03/21)  F/U HIV Ab test, cytologies (urine, rectal, oral), BMET   Descovy daily x 3 months  (pending negative HIV Ab)    Jani Gravel, PharmD PGY-1 Acute Care Resident  06/04/2021 10:15 AM

## 2021-06-05 LAB — URINE CYTOLOGY ANCILLARY ONLY
Chlamydia: NEGATIVE
Comment: NEGATIVE
Comment: NORMAL
Neisseria Gonorrhea: NEGATIVE

## 2021-06-05 LAB — CYTOLOGY, (ORAL, ANAL, URETHRAL) ANCILLARY ONLY
Chlamydia: NEGATIVE
Chlamydia: NEGATIVE
Comment: NEGATIVE
Comment: NEGATIVE
Comment: NORMAL
Comment: NORMAL
Neisseria Gonorrhea: NEGATIVE
Neisseria Gonorrhea: POSITIVE — AB

## 2021-06-05 LAB — BASIC METABOLIC PANEL
BUN: 8 mg/dL (ref 7–25)
CO2: 28 mmol/L (ref 20–32)
Calcium: 9.4 mg/dL (ref 8.6–10.3)
Chloride: 104 mmol/L (ref 98–110)
Creat: 0.96 mg/dL (ref 0.60–1.26)
Glucose, Bld: 84 mg/dL (ref 65–99)
Potassium: 4.2 mmol/L (ref 3.5–5.3)
Sodium: 140 mmol/L (ref 135–146)

## 2021-06-05 LAB — HIV ANTIBODY (ROUTINE TESTING W REFLEX): HIV 1&2 Ab, 4th Generation: NONREACTIVE

## 2021-06-05 LAB — RPR: RPR Ser Ql: NONREACTIVE

## 2021-06-09 ENCOUNTER — Other Ambulatory Visit: Payer: Self-pay | Admitting: Pharmacist

## 2021-06-09 ENCOUNTER — Other Ambulatory Visit (HOSPITAL_COMMUNITY): Payer: Self-pay

## 2021-06-09 ENCOUNTER — Encounter: Payer: Self-pay | Admitting: Pharmacist

## 2021-06-09 DIAGNOSIS — Z7252 High risk homosexual behavior: Secondary | ICD-10-CM

## 2021-06-09 MED ORDER — EMTRICITABINE-TENOFOVIR AF 200-25 MG PO TABS
1.0000 | ORAL_TABLET | Freq: Every day | ORAL | 2 refills | Status: DC
  Filled 2021-06-09 – 2021-06-20 (×4): qty 30, 30d supply, fill #0
  Filled 2021-07-11: qty 30, 30d supply, fill #1
  Filled 2021-08-06: qty 30, 30d supply, fill #2

## 2021-06-10 ENCOUNTER — Other Ambulatory Visit (HOSPITAL_COMMUNITY): Payer: Self-pay

## 2021-06-17 ENCOUNTER — Other Ambulatory Visit (HOSPITAL_COMMUNITY): Payer: Self-pay

## 2021-06-20 ENCOUNTER — Other Ambulatory Visit (HOSPITAL_COMMUNITY): Payer: Self-pay

## 2021-07-11 ENCOUNTER — Other Ambulatory Visit (HOSPITAL_COMMUNITY): Payer: Self-pay

## 2021-07-14 ENCOUNTER — Other Ambulatory Visit (HOSPITAL_COMMUNITY): Payer: Self-pay

## 2021-08-06 ENCOUNTER — Other Ambulatory Visit (HOSPITAL_COMMUNITY): Payer: Self-pay

## 2021-08-07 ENCOUNTER — Other Ambulatory Visit (HOSPITAL_COMMUNITY): Payer: Self-pay

## 2021-08-12 ENCOUNTER — Other Ambulatory Visit (HOSPITAL_COMMUNITY): Payer: Self-pay

## 2021-08-26 ENCOUNTER — Other Ambulatory Visit (HOSPITAL_COMMUNITY): Payer: Self-pay

## 2021-09-03 ENCOUNTER — Ambulatory Visit (INDEPENDENT_AMBULATORY_CARE_PROVIDER_SITE_OTHER): Payer: Self-pay | Admitting: Pharmacist

## 2021-09-03 ENCOUNTER — Other Ambulatory Visit: Payer: Self-pay

## 2021-09-03 ENCOUNTER — Other Ambulatory Visit (HOSPITAL_COMMUNITY): Payer: Self-pay

## 2021-09-03 DIAGNOSIS — Z113 Encounter for screening for infections with a predominantly sexual mode of transmission: Secondary | ICD-10-CM

## 2021-09-03 DIAGNOSIS — Z79899 Other long term (current) drug therapy: Secondary | ICD-10-CM

## 2021-09-03 NOTE — Progress Notes (Signed)
Date:  09/03/2021   HPI: Ian Humphrey is a 35 y.o. male who presents to the [facility] pharmacy clinic for HIV PrEP follow-up.  Insured   []    Uninsured  [x]    There are no problems to display for this patient.   Patient's Medications  New Prescriptions   No medications on file  Previous Medications   EMTRICITABINE-TENOFOVIR AF (DESCOVY) 200-25 MG TABLET    Take 1 tablet by mouth daily.  Modified Medications   No medications on file  Discontinued Medications   No medications on file    Allergies: No Known Allergies  Past Medical History: No past medical history on file.  Social History: Social History   Socioeconomic History   Marital status: Single    Spouse name: Not on file   Number of children: Not on file   Years of education: Not on file   Highest education level: Not on file  Occupational History   Not on file  Tobacco Use   Smoking status: Every Day    Types: Cigarettes   Smokeless tobacco: Not on file  Substance and Sexual Activity   Alcohol use: Not on file   Drug use: Not on file   Sexual activity: Not on file  Other Topics Concern   Not on file  Social History Narrative   Not on file   Social Determinants of Health   Financial Resource Strain: Not on file  Food Insecurity: Not on file  Transportation Needs: Not on file  Physical Activity: Not on file  Stress: Not on file  Social Connections: Not on file    CHL HIV PREP FLOWSHEET RESULTS 03/05/2020  Insurance Status Uninsured  Gender at birth Male  Gender identity cis-Male  Sex Partners Men only  # sex partners past 3-6 mos 1  Sex activity preferences Insertive and receptive  Condom use No  Treated for STI? Yes  HIV symptoms? Flu-like/Mono-like symptoms  PrEP Eligibility Yes    Labs:  SCr: Lab Results  Component Value Date   CREATININE 0.96 06/04/2021   CREATININE 0.98 10/02/2020   CREATININE 1.10 04/08/2020   CREATININE 0.95 03/05/2020   HIV Lab Results  Component Value  Date   HIV NON-REACTIVE 06/04/2021   HIV NON-REACTIVE 04/03/2021   HIV NON-REACTIVE 12/26/2020   HIV NON-REACTIVE 10/02/2020   HIV NON-REACTIVE 07/02/2020   Hepatitis B Lab Results  Component Value Date   HEPBSAB REACTIVE (A) 03/05/2020   HEPBSAG NON-REACTIVE 03/05/2020   Hepatitis C Lab Results  Component Value Date   HEPCAB NON-REACTIVE 03/05/2020   Hepatitis A Lab Results  Component Value Date   HAV NON-REACTIVE 03/05/2020   RPR and STI Lab Results  Component Value Date   LABRPR NON-REACTIVE 06/04/2021   LABRPR NON-REACTIVE 04/03/2021   LABRPR NON-REACTIVE 03/05/2020    STI Results GC CT  06/04/2021 Negative Negative  06/04/2021 Negative Negative  06/04/2021 Positive(A) Negative  04/03/2021 Negative Negative  04/03/2021 Negative Negative  10/02/2020 Negative Negative  03/05/2020 Negative Negative  03/05/2020 Negative Negative    Assessment: Ian Humphrey is here today to follow up for PrEP. He takes Descovy every day and reports no missed doses. He tested positive for rectal gonorrhea at last visit and was treated with ceftriaxone. He would like to test again today to make sure it has resolved. No new partners or exposures. Screened for acute HIV symptoms such as fatigue, muscle aches, rash, sore throat, lymphadenopathy, headache, night sweats, nausea/vomiting/diarrhea, and fever. Denies any symptoms. Will check labs  and see him back in 3 months.   He recently switched over to Ian Humphrey and states that it is working out very well.   Plan: - HIV antibody, RPR, urine/rectal/pharyngeal GC/CT swabs for cytology today - Descovy x 3 months if HIV negative - F/up in March  Ian Humphrey, PharmD, BCIDP, AAHIVP, CPP Clinical Pharmacist Practitioner Infectious Diseases Clinical Pharmacist [facility] 09/03/2021, 10:05 AM

## 2021-09-04 ENCOUNTER — Other Ambulatory Visit: Payer: Self-pay | Admitting: Pharmacist

## 2021-09-04 ENCOUNTER — Other Ambulatory Visit (HOSPITAL_COMMUNITY): Payer: Self-pay

## 2021-09-04 DIAGNOSIS — Z7252 High risk homosexual behavior: Secondary | ICD-10-CM

## 2021-09-04 LAB — CYTOLOGY, (ORAL, ANAL, URETHRAL) ANCILLARY ONLY
Chlamydia: NEGATIVE
Chlamydia: NEGATIVE
Comment: NEGATIVE
Comment: NEGATIVE
Comment: NORMAL
Comment: NORMAL
Neisseria Gonorrhea: NEGATIVE
Neisseria Gonorrhea: NEGATIVE

## 2021-09-04 LAB — RPR: RPR Ser Ql: NONREACTIVE

## 2021-09-04 LAB — URINE CYTOLOGY ANCILLARY ONLY
Chlamydia: NEGATIVE
Comment: NEGATIVE
Comment: NORMAL
Neisseria Gonorrhea: NEGATIVE

## 2021-09-04 LAB — HIV ANTIBODY (ROUTINE TESTING W REFLEX): HIV 1&2 Ab, 4th Generation: NONREACTIVE

## 2021-09-04 MED ORDER — EMTRICITABINE-TENOFOVIR AF 200-25 MG PO TABS
1.0000 | ORAL_TABLET | Freq: Every day | ORAL | 2 refills | Status: DC
  Filled 2021-09-04: qty 30, 30d supply, fill #0
  Filled 2021-09-30: qty 30, 30d supply, fill #1
  Filled 2021-10-27 – 2021-11-13 (×2): qty 30, 30d supply, fill #2

## 2021-09-16 ENCOUNTER — Encounter: Payer: Self-pay | Admitting: Pharmacist

## 2021-09-17 ENCOUNTER — Other Ambulatory Visit (HOSPITAL_COMMUNITY): Payer: Self-pay

## 2021-09-17 MED ORDER — DOXYCYCLINE MONOHYDRATE 100 MG PO TABS
100.0000 mg | ORAL_TABLET | Freq: Two times a day (BID) | ORAL | 0 refills | Status: DC
  Filled 2021-09-17: qty 14, 7d supply, fill #0

## 2021-09-25 ENCOUNTER — Other Ambulatory Visit (HOSPITAL_COMMUNITY): Payer: Self-pay

## 2021-09-30 ENCOUNTER — Other Ambulatory Visit (HOSPITAL_COMMUNITY): Payer: Self-pay

## 2021-10-02 ENCOUNTER — Other Ambulatory Visit (HOSPITAL_COMMUNITY): Payer: Self-pay

## 2021-10-06 ENCOUNTER — Other Ambulatory Visit (HOSPITAL_COMMUNITY): Payer: Self-pay

## 2021-10-27 ENCOUNTER — Other Ambulatory Visit (HOSPITAL_COMMUNITY): Payer: Self-pay

## 2021-10-28 ENCOUNTER — Other Ambulatory Visit (HOSPITAL_COMMUNITY): Payer: Self-pay

## 2021-11-11 ENCOUNTER — Encounter: Payer: Self-pay | Admitting: Pharmacist

## 2021-11-12 ENCOUNTER — Other Ambulatory Visit (HOSPITAL_COMMUNITY): Payer: Self-pay

## 2021-11-13 ENCOUNTER — Telehealth: Payer: Self-pay

## 2021-11-13 ENCOUNTER — Other Ambulatory Visit (HOSPITAL_COMMUNITY): Payer: Self-pay

## 2021-11-13 NOTE — Telephone Encounter (Signed)
[facility] Patient Advocate Encounter  Completed and sent [employer] Advancing Access application for Descovy for this patient who is uninsured.    Patient is approved 11/13/21 through 11/13/22.  BIN      [id number] PCN    [id number] GRP    [id number] ID        [id number]   Clearance Coots, CPhT Specialty Pharmacy Patient [facility] Phone: [phone] Fax:  [phone]

## 2021-12-01 ENCOUNTER — Ambulatory Visit: Payer: Self-pay | Admitting: Pharmacist

## 2021-12-02 ENCOUNTER — Other Ambulatory Visit (HOSPITAL_COMMUNITY): Payer: Self-pay

## 2021-12-04 ENCOUNTER — Other Ambulatory Visit: Payer: Self-pay

## 2021-12-04 ENCOUNTER — Ambulatory Visit (INDEPENDENT_AMBULATORY_CARE_PROVIDER_SITE_OTHER): Payer: Self-pay | Admitting: Pharmacist

## 2021-12-04 DIAGNOSIS — Z79899 Other long term (current) drug therapy: Secondary | ICD-10-CM

## 2021-12-04 NOTE — Progress Notes (Signed)
? ?HPI: Ian Humphrey is a 36 y.o. male who presents to the [facility] pharmacy clinic for HIV PrEP follow-up. ? ?There are no problems to display for this patient. ? ? ?Patient's Medications  ?New Prescriptions  ? No medications on file  ?Previous Medications  ? DOXYCYCLINE (ADOXA) 100 MG TABLET    Take 1 tablet (100 mg total) by mouth 2 (two) times daily for 7 days  ? EMTRICITABINE-TENOFOVIR AF (DESCOVY) 200-25 MG TABLET    Take 1 tablet by mouth daily.  ?Modified Medications  ? No medications on file  ?Discontinued Medications  ? No medications on file  ? ? ?Allergies: ?No Known Allergies ? ?Past Medical History: ?No past medical history on file. ? ?Social History: ?Social History  ? ?Socioeconomic History  ? Marital status: Single  ?  Spouse name: Not on file  ? Number of children: Not on file  ? Years of education: Not on file  ? Highest education level: Not on file  ?Occupational History  ? Not on file  ?Tobacco Use  ? Smoking status: Every Day  ?  Types: Cigarettes  ? Smokeless tobacco: Not on file  ?Substance and Sexual Activity  ? Alcohol use: Not on file  ? Drug use: Not on file  ? Sexual activity: Not on file  ?Other Topics Concern  ? Not on file  ?Social History Narrative  ? Not on file  ? ?Social Determinants of Health  ? ?Financial Resource Strain: Not on file  ?Food Insecurity: Not on file  ?Transportation Needs: Not on file  ?Physical Activity: Not on file  ?Stress: Not on file  ?Social Connections: Not on file  ? ? ?Labs: ?Lab Results  ?Component Value Date  ? HIV1RNAQUANT Not Detected 04/03/2021  ? ? ?RPR and STI ?Lab Results  ?Component Value Date  ? LABRPR NON-REACTIVE 09/03/2021  ? LABRPR NON-REACTIVE 06/04/2021  ? LABRPR NON-REACTIVE 04/03/2021  ? LABRPR NON-REACTIVE 03/05/2020  ? ? ?STI Results GC CT  ?09/03/2021 Negative Negative  ?09/03/2021 Negative Negative  ?09/03/2021 Negative Negative  ?06/04/2021 Negative Negative  ?06/04/2021 Negative Negative  ?06/04/2021 Positive(A) Negative  ?04/03/2021  Negative Negative  ?04/03/2021 Negative Negative  ?10/02/2020 Negative Negative  ?03/05/2020 Negative Negative  ?03/05/2020 Negative Negative  ? ? ?Hepatitis B ?Lab Results  ?Component Value Date  ? HEPBSAB REACTIVE (A) 03/05/2020  ? HEPBSAG NON-REACTIVE 03/05/2020  ? ?Hepatitis C ?Lab Results  ?Component Value Date  ? HEPCAB NON-REACTIVE 03/05/2020  ? ?Hepatitis A ?Lab Results  ?Component Value Date  ? HAV NON-REACTIVE 03/05/2020  ? ?Lipids: ?No results found for: CHOL, TRIG, HDL, CHOLHDL, VLDL, LDLCALC ? ?Current PrEP Regimen: ?Descovy ? ?Assessment: ?Ian Humphrey states he has had no new partners since the last time we saw him. Though he noted that he is worried about Gonorrhea. Denies any discharge, painful urination, or any other symptoms. Back in December he had bleeding from his penis- this has resolved after treatment with ceftriaxone in the ED.  ? ?States he has been compliant with Descovy when he has it, no trouble since switching to [facility] health pharmacy. We also discussed the use of Apretude, and stated he wishes to continue PrEP therapy with Descovy at this time.  ? ?Plan: ?- F/U with PrEP pharmacy clinic in 3 months (6/15)  ?- F/U HIV Ab test, cytologies (urine, rectal, oral)  ?- Descovy daily x 3 months (pending negative HIV Ab)  ?- Call with any issues or questions ? ?Thank you for allowing pharmacy to be  apart of this patient's care ? ?Feliberto Gottron, PharmD Candidate ?

## 2021-12-05 LAB — GC/CHLAMYDIA PROBE, AMP (THROAT)
Chlamydia trachomatis RNA: NOT DETECTED
Neisseria gonorrhoeae RNA: NOT DETECTED

## 2021-12-05 LAB — HIV ANTIBODY (ROUTINE TESTING W REFLEX): HIV 1&2 Ab, 4th Generation: NONREACTIVE

## 2021-12-05 LAB — CT/NG RNA, TMA RECTAL
Chlamydia Trachomatis RNA: NOT DETECTED
Neisseria Gonorrhoeae RNA: NOT DETECTED

## 2021-12-05 LAB — C. TRACHOMATIS/N. GONORRHOEAE RNA
C. trachomatis RNA, TMA: NOT DETECTED
N. gonorrhoeae RNA, TMA: NOT DETECTED

## 2021-12-05 LAB — RPR: RPR Ser Ql: NONREACTIVE

## 2021-12-08 ENCOUNTER — Other Ambulatory Visit (HOSPITAL_COMMUNITY): Payer: Self-pay

## 2021-12-11 ENCOUNTER — Other Ambulatory Visit (HOSPITAL_COMMUNITY): Payer: Self-pay

## 2021-12-11 ENCOUNTER — Other Ambulatory Visit: Payer: Self-pay | Admitting: Pharmacist

## 2021-12-11 ENCOUNTER — Other Ambulatory Visit: Payer: Self-pay

## 2021-12-11 DIAGNOSIS — Z7252 High risk homosexual behavior: Secondary | ICD-10-CM

## 2021-12-11 MED ORDER — EMTRICITABINE-TENOFOVIR AF 200-25 MG PO TABS
1.0000 | ORAL_TABLET | Freq: Every day | ORAL | 2 refills | Status: DC
  Filled 2021-12-11: qty 30, 30d supply, fill #0
  Filled 2022-01-02: qty 30, 30d supply, fill #1
  Filled 2022-01-27: qty 30, 30d supply, fill #2

## 2022-01-02 ENCOUNTER — Other Ambulatory Visit (HOSPITAL_COMMUNITY): Payer: Self-pay

## 2022-01-06 ENCOUNTER — Other Ambulatory Visit (HOSPITAL_COMMUNITY): Payer: Self-pay

## 2022-01-27 ENCOUNTER — Other Ambulatory Visit (HOSPITAL_COMMUNITY): Payer: Self-pay

## 2022-02-04 ENCOUNTER — Other Ambulatory Visit (HOSPITAL_COMMUNITY): Payer: Self-pay

## 2022-02-25 ENCOUNTER — Other Ambulatory Visit (HOSPITAL_COMMUNITY): Payer: Self-pay

## 2022-02-25 ENCOUNTER — Other Ambulatory Visit: Payer: Self-pay | Admitting: Pharmacist

## 2022-02-25 DIAGNOSIS — Z7252 High risk homosexual behavior: Secondary | ICD-10-CM

## 2022-03-05 ENCOUNTER — Other Ambulatory Visit: Payer: Self-pay

## 2022-03-05 ENCOUNTER — Other Ambulatory Visit (HOSPITAL_COMMUNITY): Payer: Self-pay

## 2022-03-05 ENCOUNTER — Ambulatory Visit (INDEPENDENT_AMBULATORY_CARE_PROVIDER_SITE_OTHER): Payer: Self-pay | Admitting: Pharmacist

## 2022-03-05 ENCOUNTER — Other Ambulatory Visit (HOSPITAL_COMMUNITY)
Admission: RE | Admit: 2022-03-05 | Discharge: 2022-03-05 | Disposition: A | Discharge status: Home / Self Care | Payer: Self-pay | Source: Ambulatory Visit | Attending: Infectious Disease | Admitting: Infectious Disease

## 2022-03-05 DIAGNOSIS — Z113 Encounter for screening for infections with a predominantly sexual mode of transmission: Secondary | ICD-10-CM

## 2022-03-05 DIAGNOSIS — Z7251 High risk heterosexual behavior: Secondary | ICD-10-CM

## 2022-03-05 DIAGNOSIS — Z79899 Other long term (current) drug therapy: Secondary | ICD-10-CM | POA: Insufficient documentation

## 2022-03-05 NOTE — Progress Notes (Signed)
Date:  03/05/2022   HPI: Ian Humphrey is a 36 y.o. male who presents to the [facility] pharmacy clinic for HIV PrEP follow-up.  Insured   []    Uninsured  [x]    There are no problems to display for this patient.   Patient's Medications  New Prescriptions   No medications on file  Previous Medications   DOXYCYCLINE (ADOXA) 100 MG TABLET    Take 1 tablet (100 mg total) by mouth 2 (two) times daily for 7 days   EMTRICITABINE-TENOFOVIR AF (DESCOVY) 200-25 MG TABLET    Take 1 tablet by mouth daily.  Modified Medications   No medications on file  Discontinued Medications   No medications on file    Allergies: No Known Allergies  Past Medical History: No past medical history on file.  Social History: Social History   Socioeconomic History   Marital status: Single    Spouse name: Not on file   Number of children: Not on file   Years of education: Not on file   Highest education level: Not on file  Occupational History   Not on file  Tobacco Use   Smoking status: Every Day    Types: Cigarettes   Smokeless tobacco: Not on file  Substance and Sexual Activity   Alcohol use: Not on file   Drug use: Not on file   Sexual activity: Not on file  Other Topics Concern   Not on file  Social History Narrative   Not on file   Social Determinants of Health   Financial Resource Strain: Not on file  Food Insecurity: Not on file  Transportation Needs: Not on file  Physical Activity: Not on file  Stress: Not on file  Social Connections: Not on file       03/05/2020    3:33 PM  CHL HIV PREP FLOWSHEET RESULTS  Insurance Status Uninsured  Gender at birth Male  Gender identity cis-Male  Sex Partners Men only  # sex partners past 3-6 mos 1  Sex activity preferences Insertive and receptive  Condom use No  Treated for STI? Yes  HIV symptoms? Flu-like/Mono-like symptoms  PrEP Eligibility Yes    Labs:  SCr: Lab Results  Component Value Date   CREATININE 0.96 06/04/2021    CREATININE 0.98 10/02/2020   CREATININE 1.10 04/08/2020   CREATININE 0.95 03/05/2020   HIV Lab Results  Component Value Date   HIV NON-REACTIVE 12/04/2021   HIV NON-REACTIVE 09/03/2021   HIV NON-REACTIVE 06/04/2021   HIV NON-REACTIVE 04/03/2021   HIV NON-REACTIVE 12/26/2020   Hepatitis B Lab Results  Component Value Date   HEPBSAB REACTIVE (A) 03/05/2020   HEPBSAG NON-REACTIVE 03/05/2020   Hepatitis C Lab Results  Component Value Date   HEPCAB NON-REACTIVE 03/05/2020   Hepatitis A Lab Results  Component Value Date   HAV NON-REACTIVE 03/05/2020   RPR and STI Lab Results  Component Value Date   LABRPR NON-REACTIVE 12/04/2021   LABRPR NON-REACTIVE 09/03/2021   LABRPR NON-REACTIVE 06/04/2021   LABRPR NON-REACTIVE 04/03/2021   LABRPR NON-REACTIVE 03/05/2020    STI Results GC CT  09/03/2021 10:10 AM Negative    Negative    Negative  Negative    Negative    Negative   06/04/2021 10:18 AM Positive    Negative    Negative  Negative    Negative    Negative   04/03/2021  2:50 PM Negative    Negative  Negative    Negative   10/02/2020  4:04  PM Negative  Negative   03/05/2020  3:45 PM Negative    Negative  Negative    Negative     Assessment: Ian Humphrey is here for PrEP follow up. He continues to do well on Descovy with no issues at all. He gets his refills on time. He does have a HIV positive partner and asked what happens if they miss doses of their medication. Explained U=U and that depending on how many doses they missed, their viral load would probably become detectable thus putting him at risk of HIV. He said there was a mix up at his partner's pharmacy and he missed about 6 days, but he started back immediately when he had his medication in hand. Also discussed that his Descovy would protect him as well. Requesting STI testing today. Will check labs, send in 3 more months of medication if he remains negative, and see him back in September. Will check his kidney  function at next appointment in September as well.   Plan: - HIV antibody, RPR, urine/rectal/pharyngeal GC/CT swabs for cytology today - Descovy x 3 months if HIV negative - F/u with me on 9/12 at 4pm  Skyylar Kopf L. Sonu Kruckenberg, PharmD, BCIDP, AAHIVP, CPP Clinical Pharmacist Practitioner Infectious Diseases Clinical Pharmacist [facility] 03/05/2022, 2:59 PM

## 2022-03-06 ENCOUNTER — Other Ambulatory Visit: Payer: Self-pay | Admitting: Pharmacist

## 2022-03-06 ENCOUNTER — Other Ambulatory Visit (HOSPITAL_COMMUNITY): Payer: Self-pay

## 2022-03-06 DIAGNOSIS — Z7252 High risk homosexual behavior: Secondary | ICD-10-CM

## 2022-03-06 LAB — CYTOLOGY, (ORAL, ANAL, URETHRAL) ANCILLARY ONLY
Chlamydia: NEGATIVE
Chlamydia: NEGATIVE
Comment: NEGATIVE
Comment: NEGATIVE
Comment: NORMAL
Comment: NORMAL
Neisseria Gonorrhea: NEGATIVE
Neisseria Gonorrhea: NEGATIVE

## 2022-03-06 LAB — RPR: RPR Ser Ql: NONREACTIVE

## 2022-03-06 LAB — URINE CYTOLOGY ANCILLARY ONLY
Chlamydia: NEGATIVE
Comment: NEGATIVE
Comment: NORMAL
Neisseria Gonorrhea: NEGATIVE

## 2022-03-06 LAB — HIV ANTIBODY (ROUTINE TESTING W REFLEX): HIV 1&2 Ab, 4th Generation: NONREACTIVE

## 2022-03-06 MED ORDER — EMTRICITABINE-TENOFOVIR AF 200-25 MG PO TABS
1.0000 | ORAL_TABLET | Freq: Every day | ORAL | 2 refills | Status: DC
  Filled 2022-03-06: qty 30, 30d supply, fill #0
  Filled 2022-03-26: qty 30, 30d supply, fill #1
  Filled 2022-04-27: qty 30, 30d supply, fill #2

## 2022-03-26 ENCOUNTER — Other Ambulatory Visit (HOSPITAL_COMMUNITY): Payer: Self-pay

## 2022-03-31 ENCOUNTER — Other Ambulatory Visit (HOSPITAL_COMMUNITY): Payer: Self-pay

## 2022-04-27 ENCOUNTER — Other Ambulatory Visit (HOSPITAL_COMMUNITY): Payer: Self-pay

## 2022-05-05 ENCOUNTER — Other Ambulatory Visit (HOSPITAL_COMMUNITY): Payer: Self-pay

## 2022-05-26 ENCOUNTER — Other Ambulatory Visit (HOSPITAL_COMMUNITY): Payer: Self-pay

## 2022-05-28 ENCOUNTER — Other Ambulatory Visit (HOSPITAL_COMMUNITY): Payer: Self-pay

## 2022-05-29 ENCOUNTER — Other Ambulatory Visit (HOSPITAL_COMMUNITY): Payer: Self-pay

## 2022-06-02 ENCOUNTER — Other Ambulatory Visit: Payer: Self-pay

## 2022-06-02 ENCOUNTER — Ambulatory Visit (INDEPENDENT_AMBULATORY_CARE_PROVIDER_SITE_OTHER): Payer: Self-pay | Admitting: Pharmacist

## 2022-06-02 DIAGNOSIS — Z113 Encounter for screening for infections with a predominantly sexual mode of transmission: Secondary | ICD-10-CM

## 2022-06-02 DIAGNOSIS — Z79899 Other long term (current) drug therapy: Secondary | ICD-10-CM

## 2022-06-02 NOTE — Progress Notes (Signed)
Date:  06/02/2022   HPI: Ian Humphrey is a 36 y.o. male who presents to the [facility] pharmacy clinic for HIV PrEP follow-up.  Insured   []    Uninsured  [x]    There are no problems to display for this patient.   Patient's Medications  New Prescriptions   No medications on file  Previous Medications   DOXYCYCLINE (ADOXA) 100 MG TABLET    Take 1 tablet (100 mg total) by mouth 2 (two) times daily for 7 days   EMTRICITABINE-TENOFOVIR AF (DESCOVY) 200-25 MG TABLET    Take 1 tablet by mouth daily.  Modified Medications   No medications on file  Discontinued Medications   No medications on file    Allergies: No Known Allergies  Past Medical History: No past medical history on file.  Social History: Social History   Socioeconomic History   Marital status: Single    Spouse name: Not on file   Number of children: Not on file   Years of education: Not on file   Highest education level: Not on file  Occupational History   Not on file  Tobacco Use   Smoking status: Every Day    Types: Cigarettes   Smokeless tobacco: Not on file  Substance and Sexual Activity   Alcohol use: Not on file   Drug use: Not on file   Sexual activity: Not on file  Other Topics Concern   Not on file  Social History Narrative   Not on file   Social Determinants of Health   Financial Resource Strain: Not on file  Food Insecurity: Not on file  Transportation Needs: Not on file  Physical Activity: Not on file  Stress: Not on file  Social Connections: Not on file       03/05/2020    3:33 PM  [facility] HIV PREP FLOWSHEET RESULTS  Insurance Status Uninsured  Gender at birth Male  Gender identity cis-Male  Sex Partners Men only  # sex partners past 3-6 mos 1  Sex activity preferences Insertive and receptive  Condom use No  Treated for STI? Yes  HIV symptoms? Flu-like/Mono-like symptoms  PrEP Eligibility Yes    Labs:  SCr: Lab Results  Component Value Date   CREATININE 0.96 06/04/2021    CREATININE 0.98 10/02/2020   CREATININE 1.10 04/08/2020   CREATININE 0.95 03/05/2020   HIV Lab Results  Component Value Date   HIV NON-REACTIVE 03/05/2022   HIV NON-REACTIVE 12/04/2021   HIV NON-REACTIVE 09/03/2021   HIV NON-REACTIVE 06/04/2021   HIV NON-REACTIVE 04/03/2021   Hepatitis B Lab Results  Component Value Date   HEPBSAB REACTIVE (A) 03/05/2020   HEPBSAG NON-REACTIVE 03/05/2020   Hepatitis C Lab Results  Component Value Date   HEPCAB NON-REACTIVE 03/05/2020   Hepatitis A Lab Results  Component Value Date   HAV NON-REACTIVE 03/05/2020   RPR and STI Lab Results  Component Value Date   LABRPR NON-REACTIVE 03/05/2022   LABRPR NON-REACTIVE 12/04/2021   LABRPR NON-REACTIVE 09/03/2021   LABRPR NON-REACTIVE 06/04/2021   LABRPR NON-REACTIVE 04/03/2021    STI Results GC CT  03/05/2022  3:01 PM Negative    Negative    Negative  Negative    Negative    Negative   09/03/2021 10:10 AM Negative    Negative    Negative  Negative    Negative    Negative   06/04/2021 10:18 AM Positive    Negative    Negative  Negative    Negative  Negative   04/03/2021  2:50 PM Negative    Negative  Negative    Negative   10/02/2020  4:04 PM Negative  Negative   03/05/2020  3:45 PM Negative    Negative  Negative    Negative     Assessment: Ian Humphrey presents to [facility] for 55m PrEP follow-up on Descovy. He reports no symptoms consistent with an acute HIV infection except a rash which developed ~ a week and a half ago. He reports 1 new sexual partner and reports his sexual encounter with this individual appeared around the same time the rash developed. He reports he has not missed doses of his Descovy. I counseled symptoms of acute HIV infection typically develop approximately 2 weeks after exposure and asked if he had another reason for his rash. He reported he works outside a lot. Rash was not diffuse and was hardly noticeable on his right arm. Patient was offered condoms during  today's visit and he accepted.   Patient was also informed he was not immune to hepatitis A and that as he was uninsured, we could not offer him the vaccine at no cost. However, did inform him that [facility] could [facility] to [facility] and they could potentially offer him the vaccine at low to no cost.    Plan: - F/U HIV Ab test  - Refill Descovy pending negative HIV Ab test  - F/U urine/oral/rectal cytologies, RPR  - F/U in 69m (09/01/22) with Cassie Kuppelweiser - Call with any questions or concerns   Jani Gravel, PharmD PGY-2 Infectious Diseases Resident  06/02/2022 4:05 PM

## 2022-06-03 ENCOUNTER — Other Ambulatory Visit (HOSPITAL_COMMUNITY): Payer: Self-pay

## 2022-06-03 ENCOUNTER — Other Ambulatory Visit: Payer: Self-pay | Admitting: Pharmacist

## 2022-06-03 DIAGNOSIS — Z7252 High risk homosexual behavior: Secondary | ICD-10-CM

## 2022-06-03 LAB — BASIC METABOLIC PANEL
BUN: 11 mg/dL (ref 7–25)
CO2: 29 mmol/L (ref 20–32)
Calcium: 9.2 mg/dL (ref 8.6–10.3)
Chloride: 103 mmol/L (ref 98–110)
Creat: 1.18 mg/dL (ref 0.60–1.26)
Glucose, Bld: 82 mg/dL (ref 65–99)
Potassium: 4.1 mmol/L (ref 3.5–5.3)
Sodium: 138 mmol/L (ref 135–146)

## 2022-06-03 LAB — HIV ANTIBODY (ROUTINE TESTING W REFLEX): HIV 1&2 Ab, 4th Generation: NONREACTIVE

## 2022-06-03 LAB — RPR: RPR Ser Ql: NONREACTIVE

## 2022-06-03 MED ORDER — EMTRICITABINE-TENOFOVIR AF 200-25 MG PO TABS
1.0000 | ORAL_TABLET | Freq: Every day | ORAL | 2 refills | Status: DC
  Filled 2022-06-03: qty 30, 30d supply, fill #0
  Filled 2022-06-23: qty 30, 30d supply, fill #1
  Filled 2022-07-21: qty 30, 30d supply, fill #2

## 2022-06-04 LAB — CYTOLOGY, (ORAL, ANAL, URETHRAL) ANCILLARY ONLY
Chlamydia: NEGATIVE
Chlamydia: NEGATIVE
Comment: NEGATIVE
Comment: NEGATIVE
Comment: NORMAL
Comment: NORMAL
Neisseria Gonorrhea: NEGATIVE
Neisseria Gonorrhea: NEGATIVE

## 2022-06-04 LAB — URINE CYTOLOGY ANCILLARY ONLY
Chlamydia: NEGATIVE
Comment: NEGATIVE
Comment: NORMAL
Neisseria Gonorrhea: NEGATIVE

## 2022-06-23 ENCOUNTER — Other Ambulatory Visit (HOSPITAL_COMMUNITY): Payer: Self-pay

## 2022-07-02 ENCOUNTER — Other Ambulatory Visit (HOSPITAL_COMMUNITY): Payer: Self-pay

## 2022-07-21 ENCOUNTER — Other Ambulatory Visit (HOSPITAL_COMMUNITY): Payer: Self-pay

## 2022-07-30 ENCOUNTER — Other Ambulatory Visit (HOSPITAL_COMMUNITY): Payer: Self-pay

## 2022-08-08 ENCOUNTER — Encounter (HOSPITAL_COMMUNITY): Payer: Self-pay | Admitting: *Deleted

## 2022-08-08 ENCOUNTER — Ambulatory Visit (HOSPITAL_COMMUNITY)
Admission: EM | Admit: 2022-08-08 | Discharge: 2022-08-08 | Disposition: A | Discharge status: Home / Self Care | Payer: Self-pay | Attending: Physician Assistant | Admitting: Physician Assistant

## 2022-08-08 DIAGNOSIS — R369 Urethral discharge, unspecified: Secondary | ICD-10-CM | POA: Insufficient documentation

## 2022-08-08 DIAGNOSIS — N2889 Other specified disorders of kidney and ureter: Secondary | ICD-10-CM | POA: Insufficient documentation

## 2022-08-08 DIAGNOSIS — R300 Dysuria: Secondary | ICD-10-CM | POA: Insufficient documentation

## 2022-08-08 LAB — POCT URINALYSIS DIPSTICK, ED / UC
Bilirubin Urine: NEGATIVE
Glucose, UA: NEGATIVE mg/dL
Ketones, ur: NEGATIVE mg/dL
Nitrite: NEGATIVE
Protein, ur: 30 mg/dL — AB
Specific Gravity, Urine: 1.025 (ref 1.005–1.030)
Urobilinogen, UA: 0.2 mg/dL (ref 0.0–1.0)
pH: 6.5 (ref 5.0–8.0)

## 2022-08-08 MED ORDER — CEFTRIAXONE SODIUM 500 MG IJ SOLR
INTRAMUSCULAR | Status: AC
  Filled 2022-08-08: qty 500

## 2022-08-08 MED ORDER — CEFTRIAXONE SODIUM 500 MG IJ SOLR
500.0000 mg | INTRAMUSCULAR | Status: DC
  Administered 2022-08-08: 500 mg via INTRAMUSCULAR

## 2022-08-08 MED ORDER — DOXYCYCLINE HYCLATE 100 MG PO CAPS: 100.0000 mg | ORAL_CAPSULE | Freq: Two times a day (BID) | ORAL | 0 refills | Status: DC

## 2022-08-08 NOTE — ED Provider Notes (Signed)
[facility]    CSN: [id number] Arrival date & time: 08/08/22  1434      History   Chief Complaint Chief Complaint  Patient presents with   Penile Discharge   Dysuria    HPI Ian Humphrey is a 36 y.o. male.   Patient presents today with a 24-hour history of dysuria and penile discharge.  He denies any genital lesions/penile ulcers, abdominal pain, fever, nausea, vomiting.  He is sexually active with male partners and does not always use condoms.  He is on PrEP and followed closely by infectious disease.  He declines HIV and syphilis testing as this is not routinely by his other providers.  He denies any history of recurrent UTI or nephrolithiasis.  Denies any recent antibiotics in the past 90 days.  He has tried Azo without improvement of symptoms.  No additional complaints or concerns today.    History reviewed. No pertinent past medical history.  There are no problems to display for this patient.   Past Surgical History:  Procedure Laterality Date   RHINOPLASTY         Home Medications    Prior to Admission medications   Medication Sig Start Date End Date Taking? Authorizing Provider  doxycycline (VIBRAMYCIN) 100 MG capsule Take 1 capsule (100 mg total) by mouth 2 (two) times daily. 08/08/22  Yes Zidan Helget K, PA-C  emtricitabine-tenofovir AF (DESCOVY) 200-25 MG tablet Take 1 tablet by mouth daily. 06/03/22  Yes Kuppelweiser, Cassie L, RPH-CPP    Family History Family History  Family history unknown: Yes    Social History Social History   Tobacco Use   Smoking status: Some Days    Types: Cigarettes, Pipe   Tobacco comments:    Stopped smoking cigarettes; smokes pipe occasionally  Vaping Use   Vaping Use: Never used  Substance Use Topics   Alcohol use: Yes    Comment: socially on weekends   Drug use: Not Currently    Types: Marijuana     Allergies   Patient has no known allergies.   Review of Systems Review of Systems   Constitutional:  Negative for activity change, appetite change, fatigue and fever.  Gastrointestinal:  Negative for abdominal pain, diarrhea, nausea and vomiting.  Genitourinary:  Positive for dysuria and penile discharge. Negative for frequency, genital sores, penile pain, penile swelling, testicular pain and urgency.  Musculoskeletal:  Negative for arthralgias, back pain and myalgias.     Physical Exam Triage Vital Signs ED Triage Vitals  Enc Vitals Group     BP 08/08/22 1623 (!) 151/95     Pulse Rate 08/08/22 1623 61     Resp 08/08/22 1623 18     Temp 08/08/22 1623 98.1 F (36.7 C)     Temp Source 08/08/22 1623 Oral     SpO2 08/08/22 1623 97 %     Weight --      Height --      Head Circumference --      Peak Flow --      Pain Score 08/08/22 1624 0     Pain Loc --      Pain Edu? --      Excl. in GC? --    No data found.  Updated Vital Signs BP (!) 151/95   Pulse 61   Temp 98.1 F (36.7 C) (Oral)   Resp 18   SpO2 97%   Visual Acuity Right Eye Distance:   Left Eye Distance:   Bilateral Distance:  Right Eye Near:   Left Eye Near:    Bilateral Near:     Physical Exam Vitals reviewed.  Constitutional:      General: He is awake.     Appearance: Normal appearance. He is well-developed. He is not ill-appearing.     Comments: Very pleasant male appears stated age in no acute distress sitting comfortably in exam room  HENT:     Head: Normocephalic and atraumatic.  Cardiovascular:     Rate and Rhythm: Normal rate and regular rhythm.     Heart sounds: Normal heart sounds, S1 normal and S2 normal. No murmur heard. Pulmonary:     Effort: Pulmonary effort is normal.     Breath sounds: Normal breath sounds. No stridor. No wheezing, rhonchi or rales.     Comments: Clear to auscultation bilaterally Abdominal:     General: Bowel sounds are normal.     Palpations: Abdomen is soft.     Tenderness: There is no abdominal tenderness. There is no right CVA tenderness,  left CVA tenderness, guarding or rebound.     Comments: Benign abdominal exam  Genitourinary:    Comments: Exam deferred Neurological:     Mental Status: He is alert.  Psychiatric:        Behavior: Behavior is cooperative.      UC Treatments / Results  Labs (all labs ordered are listed, but only abnormal results are displayed) Labs Reviewed  POCT URINALYSIS DIPSTICK, ED / UC - Abnormal; Notable for the following components:      Result Value   Hgb urine dipstick SMALL (*)    Protein, ur 30 (*)    Leukocytes,Ua MODERATE (*)    All other components within normal limits  URINE CULTURE  CYTOLOGY, (ORAL, ANAL, URETHRAL) ANCILLARY ONLY    EKG   Radiology No results found.  Procedures Procedures (including critical care time)  Medications Ordered in UC Medications  cefTRIAXone (ROCEPHIN) injection 500 mg (has no administration in time range)    Initial Impression / Assessment and Plan / UC Course  I have reviewed the triage vital signs and the nursing notes.  Pertinent labs & imaging results that were available during my care of the patient were reviewed by me and considered in my medical decision making (see chart for details).     UA was obtained that showed moderate leukocyte esterase.  Suspect this is related to urethritis, however, given associated dysuria we will send this for culture to rule out UTI.  Will defer additional antibiotics at this time.  He was given 500 mg of Rocephin in clinic and started on doxycycline 100 mg twice daily for 7 days to cover for gonorrhea/chlamydia for urethritis.  STI swab was collected today.  He declined HIV and syphilis testing.  He was instructed to abstain from sex until 7 days after completing course of treatment (a minimum of 2 weeks).  We discussed the importance of safe sex practices.  If he develops any additional or changing symptoms he is to return for reevaluation.  Strict return precautions given.  Final Clinical  Impressions(s) / UC Diagnoses   Final diagnoses:  Ureteritis  Penile discharge  Dysuria     Discharge Instructions      Your urine did have some white blood cells in it.  We will send this off for culture and contact you if we need to change your antibiotics.  We are treating you for urethritis.  Take doxycycline as prescribed.  You were given  an injection of Rocephin in clinic.  This will cover for gonorrhea and chlamydia.  Please abstain from sex until 7 days after completing treatment (minimum of 2 weeks).  You should use condoms with each sexual encounter.  If you have any additional symptoms or if anything changes you need to be reevaluated.    ED Prescriptions     Medication Sig Dispense Auth. Provider   doxycycline (VIBRAMYCIN) 100 MG capsule Take 1 capsule (100 mg total) by mouth 2 (two) times daily. 14 capsule Shanekia Latella K, PA-C      PDMP not reviewed this encounter.   Jeani Hawking, PA-C 08/08/22 1659

## 2022-08-08 NOTE — ED Triage Notes (Signed)
C/O penile discharge and dysuria onset yesterday.

## 2022-08-08 NOTE — Discharge Instructions (Signed)
Your urine did have some white blood cells in it.  We will send this off for culture and contact you if we need to change your antibiotics.  We are treating you for urethritis.  Take doxycycline as prescribed.  You were given an injection of Rocephin in clinic.  This will cover for gonorrhea and chlamydia.  Please abstain from sex until 7 days after completing treatment (minimum of 2 weeks).  You should use condoms with each sexual encounter.  If you have any additional symptoms or if anything changes you need to be reevaluated.

## 2022-08-09 LAB — URINE CULTURE: Culture: NO GROWTH

## 2022-08-11 LAB — CYTOLOGY, (ORAL, ANAL, URETHRAL) ANCILLARY ONLY
Chlamydia: NEGATIVE
Comment: NEGATIVE
Comment: NEGATIVE
Comment: NORMAL
Neisseria Gonorrhea: POSITIVE — AB
Trichomonas: NEGATIVE

## 2022-08-20 ENCOUNTER — Other Ambulatory Visit (HOSPITAL_COMMUNITY): Payer: Self-pay

## 2022-09-01 ENCOUNTER — Ambulatory Visit: Payer: Self-pay | Admitting: Pharmacist

## 2022-09-03 ENCOUNTER — Other Ambulatory Visit (HOSPITAL_COMMUNITY): Payer: Self-pay

## 2022-09-03 ENCOUNTER — Ambulatory Visit (INDEPENDENT_AMBULATORY_CARE_PROVIDER_SITE_OTHER): Payer: Self-pay | Admitting: Pharmacist

## 2022-09-03 ENCOUNTER — Other Ambulatory Visit (HOSPITAL_COMMUNITY)
Admission: RE | Admit: 2022-09-03 | Discharge: 2022-09-03 | Disposition: A | Discharge status: Home / Self Care | Payer: Self-pay | Source: Ambulatory Visit | Attending: Infectious Disease | Admitting: Infectious Disease

## 2022-09-03 ENCOUNTER — Other Ambulatory Visit: Payer: Self-pay

## 2022-09-03 DIAGNOSIS — Z113 Encounter for screening for infections with a predominantly sexual mode of transmission: Secondary | ICD-10-CM | POA: Insufficient documentation

## 2022-09-03 DIAGNOSIS — Z79899 Other long term (current) drug therapy: Secondary | ICD-10-CM

## 2022-09-03 NOTE — Progress Notes (Signed)
Date:  09/03/2022   HPI: Ian Humphrey is a 36 y.o. male who presents to the [facility] pharmacy clinic for HIV PrEP follow-up.  Insured   []    Uninsured  [x]    There are no problems to display for this patient.   Patient's Medications  New Prescriptions   No medications on file  Previous Medications   DOXYCYCLINE (VIBRAMYCIN) 100 MG CAPSULE    Take 1 capsule (100 mg total) by mouth 2 (two) times daily.   EMTRICITABINE-TENOFOVIR AF (DESCOVY) 200-25 MG TABLET    Take 1 tablet by mouth daily.  Modified Medications   No medications on file  Discontinued Medications   No medications on file    Allergies: No Known Allergies  Past Medical History: No past medical history on file.  Social History: Social History   Socioeconomic History   Marital status: Single    Spouse name: Not on file   Number of children: Not on file   Years of education: Not on file   Highest education level: Not on file  Occupational History   Not on file  Tobacco Use   Smoking status: Some Days    Types: Cigarettes, Pipe   Smokeless tobacco: Not on file   Tobacco comments:    Stopped smoking cigarettes; smokes pipe occasionally  Vaping Use   Vaping Use: Never used  Substance and Sexual Activity   Alcohol use: Yes    Comment: socially on weekends   Drug use: Not Currently    Types: Marijuana   Sexual activity: Yes  Other Topics Concern   Not on file  Social History Narrative   Not on file   Social Determinants of Health   Financial Resource Strain: Not on file  Food Insecurity: Not on file  Transportation Needs: Not on file  Physical Activity: Not on file  Stress: Not on file  Social Connections: Not on file       03/05/2020    3:33 PM  [facility] HIV PREP FLOWSHEET RESULTS  Insurance Status Uninsured  Gender at birth Male  Gender identity cis-Male  Sex Partners Men only  # sex partners past 3-6 mos 1  Sex activity preferences Insertive and receptive  Condom use No  Treated for STI?  Yes  HIV symptoms? Flu-like/Mono-like symptoms  PrEP Eligibility Yes    Labs:  SCr: Lab Results  Component Value Date   CREATININE 1.18 06/02/2022   CREATININE 0.96 06/04/2021   CREATININE 0.98 10/02/2020   CREATININE 1.10 04/08/2020   CREATININE 0.95 03/05/2020   HIV Lab Results  Component Value Date   HIV NON-REACTIVE 06/02/2022   HIV NON-REACTIVE 03/05/2022   HIV NON-REACTIVE 12/04/2021   HIV NON-REACTIVE 09/03/2021   HIV NON-REACTIVE 06/04/2021   Hepatitis B Lab Results  Component Value Date   HEPBSAB REACTIVE (A) 03/05/2020   HEPBSAG NON-REACTIVE 03/05/2020   Hepatitis C Lab Results  Component Value Date   HEPCAB NON-REACTIVE 03/05/2020   Hepatitis A Lab Results  Component Value Date   HAV NON-REACTIVE 03/05/2020   RPR and STI Lab Results  Component Value Date   LABRPR NON-REACTIVE 06/02/2022   LABRPR NON-REACTIVE 03/05/2022   LABRPR NON-REACTIVE 12/04/2021   LABRPR NON-REACTIVE 09/03/2021   LABRPR NON-REACTIVE 06/04/2021    STI Results GC CT  08/08/2022  4:47 PM Positive  Negative   06/02/2022  4:12 PM Negative    Negative    Negative  Negative    Negative    Negative   03/05/2022  3:01 PM Negative    Negative    Negative  Negative    Negative    Negative   09/03/2021 10:10 AM Negative    Negative    Negative  Negative    Negative    Negative   06/04/2021 10:18 AM Positive    Negative    Negative  Negative    Negative    Negative   04/03/2021  2:50 PM Negative    Negative  Negative    Negative   10/02/2020  4:04 PM Negative  Negative   03/05/2020  3:45 PM Negative    Negative  Negative    Negative     Assessment: Ian Humphrey is here today to follow up for HIV PrEP. He is doing well on Descovy and having no issues with taking or tolerating it. Screened for acute HIV symptoms such as fatigue, muscle aches, rash, sore throat, lymphadenopathy, headache, night sweats, nausea/vomiting/diarrhea, and fever. Denies any symptoms. Will check  for HIV and STIs today and see him back in 3 month.   Plan: - HIV antibody, RPR, urine/rectal/pharyngeal GC/CT swabs for cytology today - Descovy x 3 months if HIV negative - F/u with me again on 11/30/22  Constantin Hillery L. Eber Hong, PharmD, BCIDP, AAHIVP, CPP Clinical Pharmacist Practitioner Infectious Diseases [place] [facility] 09/03/2022, 11:40 AM

## 2022-09-04 LAB — CYTOLOGY, (ORAL, ANAL, URETHRAL) ANCILLARY ONLY
Chlamydia: NEGATIVE
Chlamydia: NEGATIVE
Comment: NEGATIVE
Comment: NEGATIVE
Comment: NORMAL
Comment: NORMAL
Neisseria Gonorrhea: NEGATIVE
Neisseria Gonorrhea: NEGATIVE

## 2022-09-04 LAB — URINE CYTOLOGY ANCILLARY ONLY
Chlamydia: NEGATIVE
Comment: NEGATIVE
Comment: NORMAL
Neisseria Gonorrhea: NEGATIVE

## 2022-09-04 LAB — HIV ANTIBODY (ROUTINE TESTING W REFLEX): HIV 1&2 Ab, 4th Generation: NONREACTIVE

## 2022-09-04 LAB — RPR: RPR Ser Ql: NONREACTIVE

## 2022-09-07 ENCOUNTER — Other Ambulatory Visit: Payer: Self-pay

## 2022-09-07 ENCOUNTER — Other Ambulatory Visit (HOSPITAL_COMMUNITY): Payer: Self-pay

## 2022-09-07 ENCOUNTER — Other Ambulatory Visit: Payer: Self-pay | Admitting: Pharmacist

## 2022-09-07 DIAGNOSIS — Z7252 High risk homosexual behavior: Secondary | ICD-10-CM

## 2022-09-07 MED ORDER — EMTRICITABINE-TENOFOVIR AF 200-25 MG PO TABS
1.0000 | ORAL_TABLET | Freq: Every day | ORAL | 2 refills | Status: DC
  Filled 2022-09-07: qty 30, 30d supply, fill #0
  Filled 2022-09-25: qty 30, 30d supply, fill #1
  Filled 2022-10-20: qty 30, 30d supply, fill #2

## 2022-09-25 ENCOUNTER — Other Ambulatory Visit (HOSPITAL_COMMUNITY): Payer: Self-pay

## 2022-10-01 ENCOUNTER — Other Ambulatory Visit (HOSPITAL_COMMUNITY): Payer: Self-pay

## 2022-10-09 ENCOUNTER — Ambulatory Visit
Admission: EM | Admit: 2022-10-09 | Discharge: 2022-10-09 | Disposition: A | Discharge status: Home / Self Care | Payer: Self-pay | Attending: Urgent Care | Admitting: Urgent Care

## 2022-10-09 DIAGNOSIS — Z202 Contact with and (suspected) exposure to infections with a predominantly sexual mode of transmission: Secondary | ICD-10-CM | POA: Insufficient documentation

## 2022-10-09 DIAGNOSIS — R369 Urethral discharge, unspecified: Secondary | ICD-10-CM | POA: Insufficient documentation

## 2022-10-09 DIAGNOSIS — Z7251 High risk heterosexual behavior: Secondary | ICD-10-CM | POA: Insufficient documentation

## 2022-10-09 HISTORY — DX: Herpesviral infection, unspecified: B00.9

## 2022-10-09 MED ORDER — CEFTRIAXONE SODIUM 500 MG IJ SOLR
500.0000 mg | INTRAMUSCULAR | Status: DC
  Administered 2022-10-09: 500 mg via INTRAMUSCULAR

## 2022-10-09 MED ORDER — DOXYCYCLINE HYCLATE 100 MG PO CAPS: 100.0000 mg | ORAL_CAPSULE | Freq: Two times a day (BID) | ORAL | 0 refills | Status: AC

## 2022-10-09 NOTE — ED Provider Notes (Signed)
[facility]  Note:  This document was prepared using Systems analyst and may include unintentional dictation errors.  MRN: [id number] DOB: August 22, 1986  Subjective:   Ian Humphrey is a 37 y.o. male presenting for 5 day history of persistent penile discharge, dysuria. Patient is on Descovy. Has unprotected sex, male partners, has 1 partner. They called him today and advised he get tested today. There is a possible exposure but he does not know which it is. He is Descovy.   No current facility-administered medications for this encounter.  Current Outpatient Medications:    emtricitabine-tenofovir AF (DESCOVY) 200-25 MG tablet, Take 1 tablet by mouth daily., Disp: 30 tablet, Rfl: 2   No Known Allergies  No past medical history on file.   Past Surgical History:  Procedure Laterality Date   RHINOPLASTY      Family History  Family history unknown: Yes    Social History   Tobacco Use   Smoking status: Some Days    Types: Cigarettes, Pipe   Tobacco comments:    Stopped smoking cigarettes; smokes pipe occasionally  Vaping Use   Vaping Use: Never used  Substance Use Topics   Alcohol use: Yes    Comment: socially on weekends   Drug use: Not Currently    Types: Marijuana    ROS   Objective:   Vitals: BP (!) 145/97 (BP Location: Right Arm)   Pulse 80   Temp 98.1 F (36.7 C) (Oral)   Resp 16   SpO2 95%   Physical Exam Constitutional:      General: He is not in acute distress.    Appearance: Normal appearance. He is well-developed and normal weight. He is not ill-appearing, toxic-appearing or diaphoretic.  HENT:     Head: Normocephalic and atraumatic.     Right Ear: External ear normal.     Left Ear: External ear normal.     Nose: Nose normal.     Mouth/Throat:     Pharynx: Oropharynx is clear.  Eyes:     General: No scleral icterus.       Right eye: No discharge.        Left eye: No discharge.     Extraocular  Movements: Extraocular movements intact.  Cardiovascular:     Rate and Rhythm: Normal rate.  Pulmonary:     Effort: Pulmonary effort is normal.  Genitourinary:    Penis: Circumcised. Discharge present. No phimosis, paraphimosis, hypospadias, erythema, tenderness, swelling or lesions.   Musculoskeletal:     Cervical back: Normal range of motion.  Neurological:     Mental Status: He is alert and oriented to person, place, and time.  Psychiatric:        Mood and Affect: Mood normal.        Behavior: Behavior normal.        Thought Content: Thought content normal.        Judgment: Judgment normal.    IM ceftriaxone 500mg  in clinic.   Assessment and Plan :   PDMP not reviewed this encounter.  1. Unprotected sex   2. Possible exposure to STD   3. Penile discharge     Patient treated empirically as per CDC guidelines with IM ceftriaxone, doxycycline as an outpatient.  Labs pending.   Counseled on safe sex practices including abstaining for 1 week following treatment.  Counseled patient on potential for adverse effects with medications prescribed/recommended today, ER and return-to-clinic precautions discussed, patient verbalized understanding.  Jaynee Eagles, PA-C 10/09/22 1505

## 2022-10-09 NOTE — Discharge Instructions (Signed)
Avoid all forms of sexual intercourse (oral, vaginal, anal) for the next 7 days to avoid spreading/reinfecting or at least until we can see what kinds of infection results are positive.  Abstaining for 2 weeks would be better but at least 1 week is required.  We will let you know about your test results from the swab we did today and if you need any prescriptions for antibiotics or changes to your treatment from today.  

## 2022-10-09 NOTE — ED Triage Notes (Signed)
Pt c/o penile d/x x 5 days with +STD exposure-NAD-steady gait

## 2022-10-10 LAB — HIV ANTIBODY (ROUTINE TESTING W REFLEX): HIV Screen 4th Generation wRfx: NONREACTIVE

## 2022-10-10 LAB — RPR: RPR Ser Ql: NONREACTIVE

## 2022-10-15 LAB — CYTOLOGY, (ORAL, ANAL, URETHRAL) ANCILLARY ONLY
Chlamydia: NEGATIVE
Comment: NEGATIVE
Comment: NEGATIVE
Comment: NORMAL
Neisseria Gonorrhea: POSITIVE — AB
Trichomonas: NEGATIVE

## 2022-10-20 ENCOUNTER — Other Ambulatory Visit (HOSPITAL_COMMUNITY): Payer: Self-pay

## 2022-10-22 ENCOUNTER — Ambulatory Visit
Admission: EM | Admit: 2022-10-22 | Discharge: 2022-10-22 | Disposition: A | Discharge status: Home / Self Care | Payer: Self-pay | Attending: Nurse Practitioner | Admitting: Nurse Practitioner

## 2022-10-22 ENCOUNTER — Ambulatory Visit: Admit: 2022-10-22 | Payer: Self-pay

## 2022-10-22 DIAGNOSIS — J069 Acute upper respiratory infection, unspecified: Secondary | ICD-10-CM

## 2022-10-22 DIAGNOSIS — Z113 Encounter for screening for infections with a predominantly sexual mode of transmission: Secondary | ICD-10-CM | POA: Insufficient documentation

## 2022-10-22 DIAGNOSIS — R051 Acute cough: Secondary | ICD-10-CM

## 2022-10-22 DIAGNOSIS — U071 COVID-19: Secondary | ICD-10-CM | POA: Insufficient documentation

## 2022-10-22 NOTE — ED Triage Notes (Signed)
P c/o diarrhea, headache, congestion, sore throat and dizziness.   The patient states he was recently treated for gonorrhea. Patient states he has penile discomfort with urination, he denies having any penile discharge.   Started: 2 days ago   Home interventions: robitussin

## 2022-10-22 NOTE — ED Provider Notes (Addendum)
[facility]    CSN: [id number] Arrival date & time: 10/22/22  1203      History   Chief Complaint Chief Complaint  Patient presents with   Sore Throat   Nasal Congestion   Diarrhea   Headache   Dizziness    HPI Ian Humphrey is a 37 y.o. male  who presents for evaluation of URI symptoms for 2 days. Patient reports associated symptoms of cough, congestion, sore throat, headache, diarrhea, occasional dizziness. Denies N/V, fevers, chills, ear pain, body aches, shortness of breath. Patient does not have a hx of asthma.e is an active smoker.  No known sick contacts and no recent travel. Pt has taken Robitussin OTC for symptoms.  Patient was treated for positive gonorrhea on 1/19.  States all of the symptoms have resolved.  He had negative chlamydia testing at that time.  He states his symptoms have resolved.  He reports his partner is being treated with azithromycin but he is not sure for what it is concerned that he may have chlamydia or anal gonorrhea.  Denies any symptoms outside of the diarrhea.  He would like an anal swab specifically for chlamydia. pt has no other concerns at this time.    Sore Throat Associated symptoms include headaches.  Diarrhea Associated symptoms: headaches   Headache Associated symptoms: congestion, cough, diarrhea, dizziness and sore throat   Dizziness Associated symptoms: diarrhea and headaches     Past Medical History:  Diagnosis Date   Herpes     There are no problems to display for this patient.   Past Surgical History:  Procedure Laterality Date   RHINOPLASTY         Home Medications    Prior to Admission medications   Medication Sig Start Date End Date Taking? Authorizing Provider  acyclovir (ZOVIRAX) 400 MG tablet Take 400 mg by mouth 5 (five) times daily.    [provider]  doxycycline (VIBRAMYCIN) 100 MG capsule Take 1 capsule (100 mg total) by mouth 2 (two) times daily. 10/09/22   Jaynee Eagles, PA-C   emtricitabine-tenofovir AF (DESCOVY) 200-25 MG tablet Take 1 tablet by mouth daily. 09/07/22   Kuppelweiser, Gillian Shields, RPH-CPP    Family History Family History  Family history unknown: Yes    Social History Social History   Tobacco Use   Smoking status: Every Day    Types: Cigars, Cigarettes   Smokeless tobacco: Never   Tobacco comments:    Stopped smoking cigarettes; smokes pipe occasionally  Vaping Use   Vaping Use: Every day  Substance Use Topics   Alcohol use: Yes    Comment: occ   Drug use: Not Currently     Allergies   Patient has no known allergies.   Review of Systems Review of Systems  HENT:  Positive for congestion and sore throat.   Respiratory:  Positive for cough.   Gastrointestinal:  Positive for diarrhea.  Neurological:  Positive for dizziness and headaches.     Physical Exam Triage Vital Signs ED Triage Vitals [10/22/22 1216]  Enc Vitals Group     BP (!) 144/101     Pulse Rate 75     Resp 18     Temp 98 F (36.7 C)     Temp Source Oral     SpO2 97 %     Weight      Height      Head Circumference      Peak Flow      Pain  Score 7     Pain Loc      Pain Edu?      Excl. in [place]?    No data found.  Updated Vital Signs BP (!) 148/95 (BP Location: Left Arm)   Pulse 75   Temp 98 F (36.7 C) (Oral)   Resp 18   SpO2 97%   Visual Acuity Right Eye Distance:   Left Eye Distance:   Bilateral Distance:    Right Eye Near:   Left Eye Near:    Bilateral Near:     Physical Exam Vitals and nursing note reviewed.  Constitutional:      General: He is not in acute distress.    Appearance: Normal appearance. He is not ill-appearing or toxic-appearing.  HENT:     Head: Normocephalic and atraumatic.     Right Ear: Tympanic membrane and ear canal normal.     Left Ear: Tympanic membrane and ear canal normal.     Nose: Congestion present.     Mouth/Throat:     Mouth: Mucous membranes are moist.     Pharynx: Posterior oropharyngeal erythema  present.  Eyes:     Pupils: Pupils are equal, round, and reactive to light.  Cardiovascular:     Rate and Rhythm: Normal rate and regular rhythm.     Heart sounds: Normal heart sounds.  Pulmonary:     Effort: Pulmonary effort is normal.     Breath sounds: Normal breath sounds.  Musculoskeletal:     Cervical back: Normal range of motion and neck supple.  Lymphadenopathy:     Cervical: No cervical adenopathy.  Skin:    General: Skin is warm and dry.  Neurological:     General: No focal deficit present.     Mental Status: He is alert and oriented to person, place, and time.  Psychiatric:        Mood and Affect: Mood normal.        Behavior: Behavior normal.      UC Treatments / Results  Labs (all labs ordered are listed, but only abnormal results are displayed) Labs Reviewed  SARS CORONAVIRUS 2 (TAT 6-24 HRS)  CYTOLOGY, (ORAL, ANAL, URETHRAL) ANCILLARY ONLY    EKG   Radiology No results found.  Procedures Procedures (including critical care time)  Medications Ordered in UC Medications - No data to display  Initial Impression / Assessment and Plan / UC Course  I have reviewed the triage vital signs and the nursing notes.  Pertinent labs & imaging results that were available during my care of the patient were reviewed by me and considered in my medical decision making (see chart for details).     Reviewed exam and symptoms with patient.  No red flags on exam. COVID PCR and will contact with results Discussed viral URI and continuing symptomatic treatment with over-the-counter cough medicine Rest and fluids Patient requested requested anal testing for chlamydia.  Will contact if positive Follow-up with PCP in 2 days for recheck ER precautions reviewed and patient verbalized understanding Final Clinical Impressions(s) / UC Diagnoses   Final diagnoses:  Screening examination for STD (sexually transmitted disease)  Acute cough  Viral upper respiratory tract  infection     Discharge Instructions      Clinical contact you with results of the testing done today if they are positive Continue over-the-counter cough medication as needed Rest and fluids Follow-up with your PCP in 2 days for recheck Please go to the emergency room for any  worsening symptoms   ED Prescriptions   None    PDMP not reviewed this encounter.   Melynda Ripple, NP 10/22/22 1256    Melynda Ripple, NP 10/22/22 1256

## 2022-10-22 NOTE — Discharge Instructions (Signed)
Clinical contact you with results of the testing done today if they are positive Continue over-the-counter cough medication as needed Rest and fluids Follow-up with your PCP in 2 days for recheck Please go to the emergency room for any worsening symptoms

## 2022-10-23 LAB — CYTOLOGY, (ORAL, ANAL, URETHRAL) ANCILLARY ONLY
Chlamydia: NEGATIVE
Comment: NEGATIVE
Comment: NORMAL
Neisseria Gonorrhea: NEGATIVE

## 2022-10-23 LAB — SARS CORONAVIRUS 2 (TAT 6-24 HRS): SARS Coronavirus 2: POSITIVE — AB

## 2022-10-26 ENCOUNTER — Other Ambulatory Visit: Payer: Self-pay

## 2022-10-27 ENCOUNTER — Other Ambulatory Visit (HOSPITAL_COMMUNITY): Payer: Self-pay

## 2022-10-27 ENCOUNTER — Other Ambulatory Visit: Payer: Self-pay

## 2022-11-12 ENCOUNTER — Other Ambulatory Visit: Payer: Self-pay

## 2022-11-23 ENCOUNTER — Other Ambulatory Visit (HOSPITAL_COMMUNITY): Payer: Self-pay

## 2022-11-30 ENCOUNTER — Other Ambulatory Visit (HOSPITAL_COMMUNITY): Payer: Self-pay

## 2022-11-30 ENCOUNTER — Ambulatory Visit (INDEPENDENT_AMBULATORY_CARE_PROVIDER_SITE_OTHER): Payer: Self-pay | Admitting: Pharmacist

## 2022-11-30 ENCOUNTER — Other Ambulatory Visit: Payer: Self-pay

## 2022-11-30 DIAGNOSIS — Z7252 High risk homosexual behavior: Secondary | ICD-10-CM

## 2022-11-30 DIAGNOSIS — Z79899 Other long term (current) drug therapy: Secondary | ICD-10-CM

## 2022-11-30 DIAGNOSIS — Z113 Encounter for screening for infections with a predominantly sexual mode of transmission: Secondary | ICD-10-CM

## 2022-11-30 DIAGNOSIS — Z2981 Encounter for HIV pre-exposure prophylaxis: Secondary | ICD-10-CM

## 2022-11-30 MED ORDER — EMTRICITABINE-TENOFOVIR AF 200-25 MG PO TABS
1.0000 | ORAL_TABLET | Freq: Every day | ORAL | 2 refills | Status: DC
  Filled 2022-11-30 – 2022-12-08 (×2): qty 30, 30d supply, fill #0
  Filled 2022-12-29: qty 30, 30d supply, fill #1
  Filled 2023-01-26: qty 30, 30d supply, fill #2

## 2022-11-30 NOTE — Progress Notes (Signed)
Date:  11/30/2022   HPI: Ian Humphrey is a 37 y.o. male who presents to the [place] clinic for HIV PrEP follow-up.  Insured   '[]'$    Uninsured  '[x]'$    There are no problems to display for this patient.   Patient's Medications  New Prescriptions   No medications on file  Previous Medications   ACYCLOVIR (ZOVIRAX) 400 MG TABLET    Take 400 mg by mouth 5 (five) times daily.   DOXYCYCLINE (VIBRAMYCIN) 100 MG CAPSULE    Take 1 capsule (100 mg total) by mouth 2 (two) times daily.   EMTRICITABINE-TENOFOVIR AF (DESCOVY) 200-25 MG TABLET    Take 1 tablet by mouth daily.  Modified Medications   No medications on file  Discontinued Medications   No medications on file    Allergies: No Known Allergies  Past Medical History: Past Medical History:  Diagnosis Date   Herpes     Social History: Social History   Socioeconomic History   Marital status: Single    Spouse name: Not on file   Number of children: Not on file   Years of education: Not on file   Highest education level: Not on file  Occupational History   Not on file  Tobacco Use   Smoking status: Every Day    Types: Cigars, Cigarettes   Smokeless tobacco: Never   Tobacco comments:    Stopped smoking cigarettes; smokes pipe occasionally  Vaping Use   Vaping Use: Every day  Substance and Sexual Activity   Alcohol use: Yes    Comment: occ   Drug use: Not Currently   Sexual activity: Yes  Other Topics Concern   Not on file  Social History Narrative   Not on file   Social Determinants of Health   Financial Resource Strain: Not on file  Food Insecurity: Not on file  Transportation Needs: Not on file  Physical Activity: Not on file  Stress: Not on file  Social Connections: Not on file       03/05/2020    3:33 PM  [facility] HIV PREP FLOWSHEET RESULTS  Insurance Status Uninsured  Gender at birth Male  Gender identity cis-Male  Sex Partners Men only  # sex partners past 3-6 mos 1  Sex activity preferences  Insertive and receptive  Condom use No  Treated for STI? Yes  HIV symptoms? Flu-like/Mono-like symptoms  PrEP Eligibility Yes    Labs:  SCr: Lab Results  Component Value Date   CREATININE 1.18 06/02/2022   CREATININE 0.96 06/04/2021   CREATININE 0.98 10/02/2020   CREATININE 1.10 04/08/2020   CREATININE 0.95 03/05/2020   HIV Lab Results  Component Value Date   HIV Non Reactive 10/09/2022   HIV NON-REACTIVE 09/03/2022   HIV NON-REACTIVE 06/02/2022   HIV NON-REACTIVE 03/05/2022   HIV NON-REACTIVE 12/04/2021   Hepatitis B Lab Results  Component Value Date   HEPBSAB REACTIVE (A) 03/05/2020   HEPBSAG NON-REACTIVE 03/05/2020   Hepatitis C Lab Results  Component Value Date   HEPCAB NON-REACTIVE 03/05/2020   Hepatitis A Lab Results  Component Value Date   HAV NON-REACTIVE 03/05/2020   RPR and STI Lab Results  Component Value Date   LABRPR Non Reactive 10/09/2022   LABRPR NON-REACTIVE 09/03/2022   LABRPR NON-REACTIVE 06/02/2022   LABRPR NON-REACTIVE 03/05/2022   LABRPR NON-REACTIVE 12/04/2021    STI Results GC CT  10/22/2022 12:54 PM Negative  Negative   10/09/2022  3:13 PM Positive  Negative   09/03/2022  11:42 AM Negative    Negative    Negative  Negative    Negative    Negative   08/08/2022  4:47 PM Positive  Negative   06/02/2022  4:12 PM Negative    Negative    Negative  Negative    Negative    Negative   03/05/2022  3:01 PM Negative    Negative    Negative  Negative    Negative    Negative   09/03/2021 10:10 AM Negative    Negative    Negative  Negative    Negative    Negative   06/04/2021 10:18 AM Positive    Negative    Negative  Negative    Negative    Negative   04/03/2021  2:50 PM Negative    Negative  Negative    Negative   10/02/2020  4:04 PM Negative  Negative   03/05/2020  3:45 PM Negative    Negative  Negative    Negative     Assessment: Ian Humphrey is here today to follow up for HIV PrEP. He is doing well on Descovy with  no issues or missed doses. He was exposed to gonorrhea and had penile discharge back in January. He was seen at the ED and treated appropriately. His symptoms have resolved. He has no symptoms today but is requesting full testing. He states that his sister-in-law is going to be a surrogate for him and his partner. He is very excited about this. I congratulated him!  Will check labs and see him back in 3 months. 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  Plan: - HIV antibody, RPR, and urine/rectal/pharyngeal cytologies for GC/chlamydia today - Descovy x 3 months if HIV negative - F/u with me again on 03/01/23  Dream Nodal L. Jaydynn Wolford, PharmD, BCIDP, [place], CPP Clinical Pharmacist Practitioner Infectious Diseases [facility] 11/30/2022, 4:06 PM

## 2022-12-01 ENCOUNTER — Other Ambulatory Visit (HOSPITAL_COMMUNITY): Payer: Self-pay

## 2022-12-01 LAB — HIV ANTIBODY (ROUTINE TESTING W REFLEX): HIV 1&2 Ab, 4th Generation: NONREACTIVE

## 2022-12-01 LAB — RPR: RPR Ser Ql: NONREACTIVE

## 2022-12-02 ENCOUNTER — Other Ambulatory Visit (HOSPITAL_COMMUNITY): Payer: Self-pay

## 2022-12-02 LAB — CYTOLOGY, (ORAL, ANAL, URETHRAL) ANCILLARY ONLY
Chlamydia: NEGATIVE
Chlamydia: NEGATIVE
Comment: NEGATIVE
Comment: NEGATIVE
Comment: NORMAL
Comment: NORMAL
Neisseria Gonorrhea: NEGATIVE
Neisseria Gonorrhea: NEGATIVE

## 2022-12-02 LAB — URINE CYTOLOGY ANCILLARY ONLY
Chlamydia: NEGATIVE
Comment: NEGATIVE
Comment: NORMAL
Neisseria Gonorrhea: NEGATIVE

## 2022-12-07 ENCOUNTER — Telehealth: Payer: Self-pay

## 2022-12-07 NOTE — Telephone Encounter (Signed)
[facility] Patient Advocate Encounter  Completed and sent [employer] Advancing Access application for Descovy for this patient who is uninsured.    Patient assistance phone number for follow up is [phone].   This encounter will be updated until final determination.,   Ileene Patrick, [place] Patient [facility] Phone: [phone] Fax:  [phone]

## 2022-12-08 ENCOUNTER — Telehealth: Payer: Self-pay

## 2022-12-08 ENCOUNTER — Other Ambulatory Visit (HOSPITAL_COMMUNITY): Payer: Self-pay

## 2022-12-08 ENCOUNTER — Other Ambulatory Visit: Payer: Self-pay

## 2022-12-08 NOTE — Telephone Encounter (Signed)
[facility] Patient Advocate Encounter  Completed and sent [employer] Advancing Access application for Descovy for this patient who is uninsured.    Patient is approved 12/08/22 through 12/08/23.  BIN      [id number] PCN    [id number] GRP    [id number] ID        [id number]   Ian Humphrey, [place] Patient [facility] for Infect[facility] Disease Phone: [phone] Fax:  [phone]

## 2022-12-29 ENCOUNTER — Other Ambulatory Visit (HOSPITAL_COMMUNITY): Payer: Self-pay

## 2022-12-29 IMAGING — DX DG HAND COMPLETE 3+V*L*
3 series · 3 of 3 positions shown · non-contrast
Comparison: None.

CLINICAL DATA: Left hand pain, injury

EXAM:
LEFT HAND - COMPLETE 3+ VIEW

[hand pa]
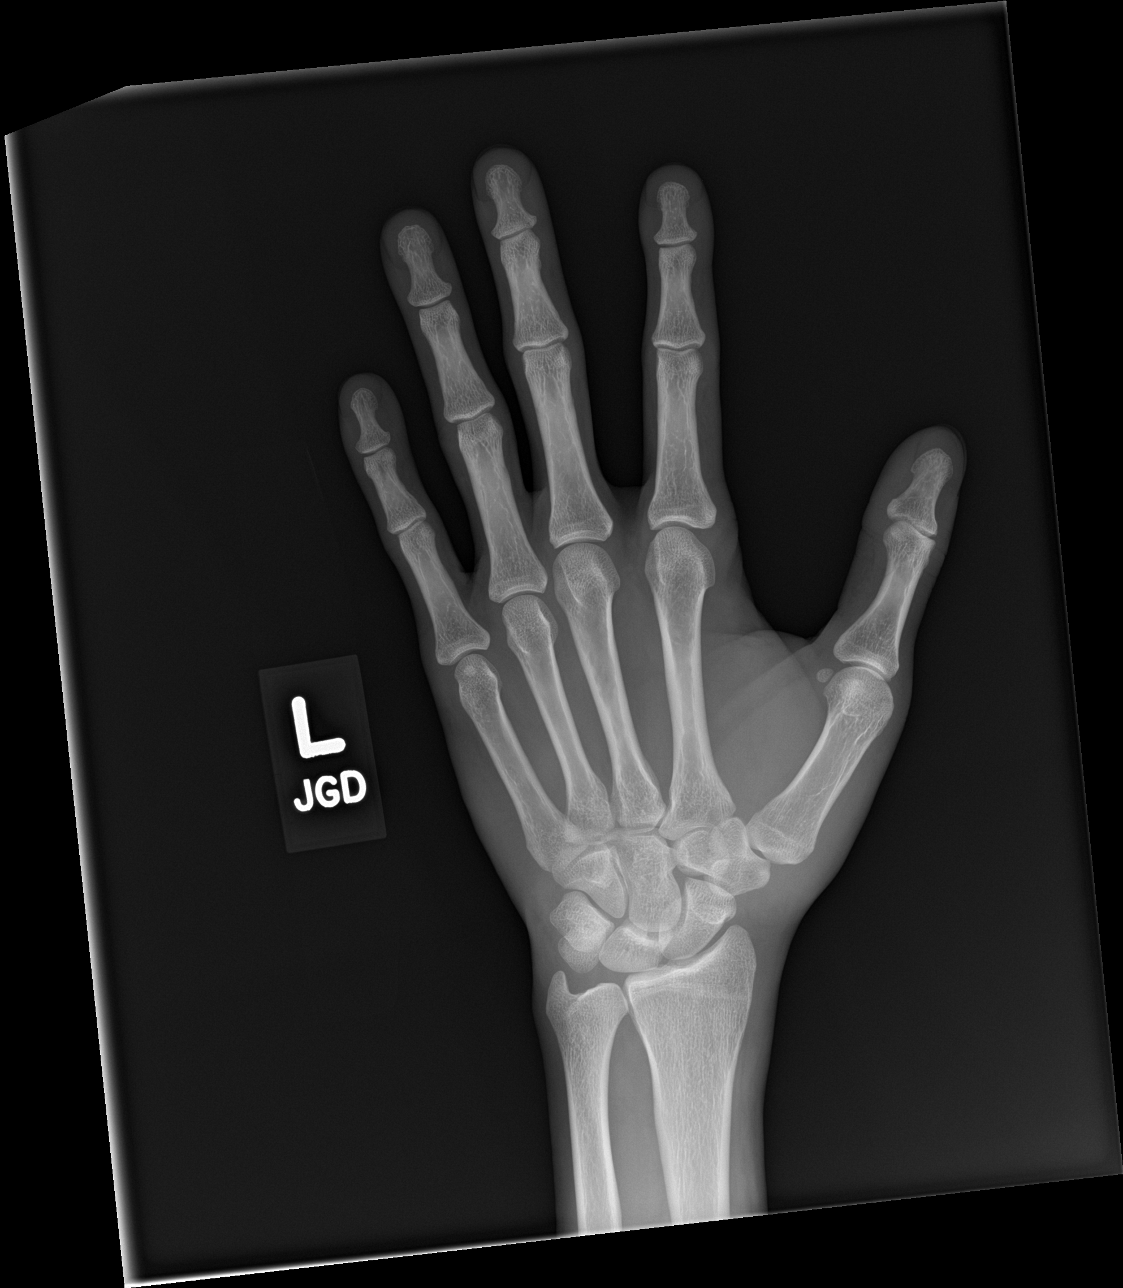

[hand obl]
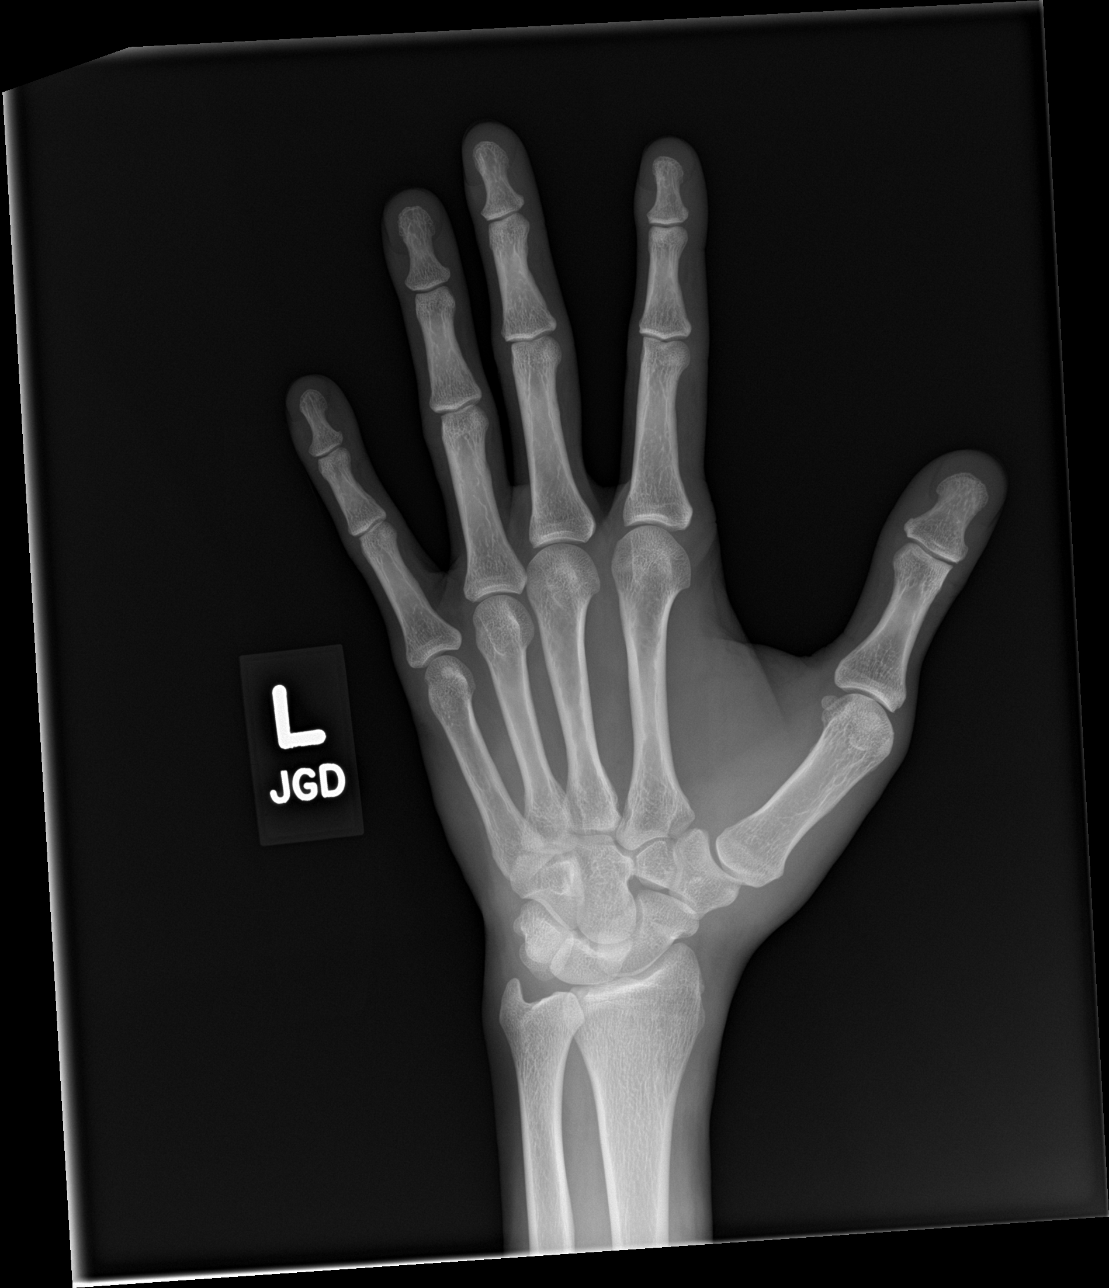

[hand lat]
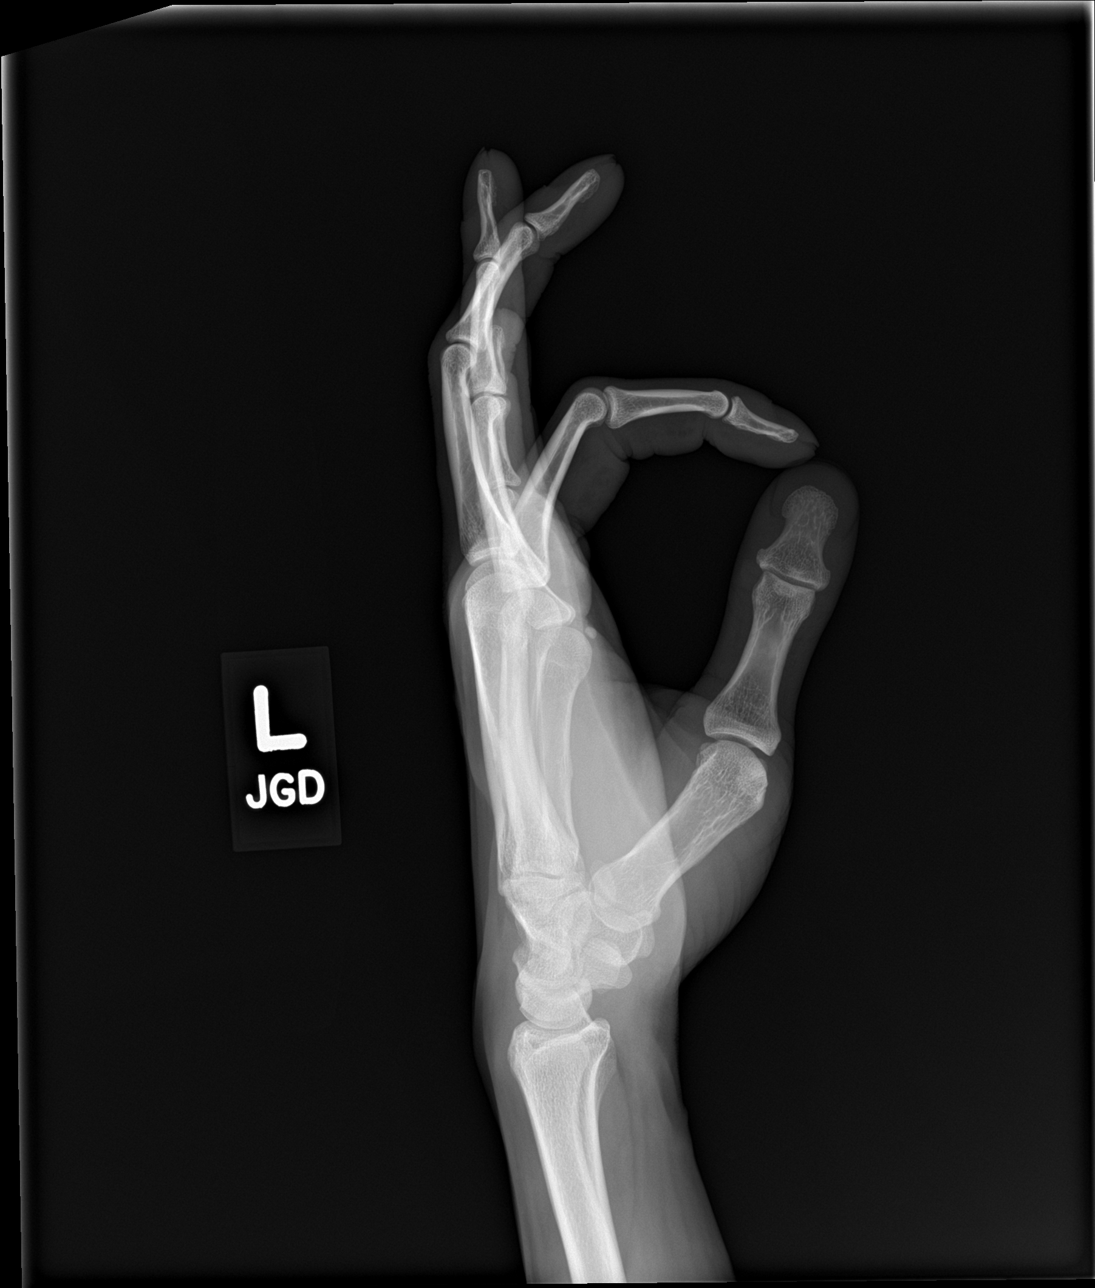

[3 of 3 positions shown; findings below may reference images not displayed]

FINDINGS: There is no evidence of fracture or dislocation. There is no
evidence of arthropathy or other focal bone abnormality.
IMPRESSION: No acute osseous abnormality.

## 2023-01-01 ENCOUNTER — Other Ambulatory Visit: Payer: Self-pay

## 2023-01-01 ENCOUNTER — Other Ambulatory Visit (HOSPITAL_COMMUNITY): Payer: Self-pay

## 2023-01-26 ENCOUNTER — Other Ambulatory Visit (HOSPITAL_COMMUNITY): Payer: Self-pay

## 2023-01-29 ENCOUNTER — Other Ambulatory Visit: Payer: Self-pay

## 2023-02-23 ENCOUNTER — Other Ambulatory Visit (HOSPITAL_COMMUNITY): Payer: Self-pay

## 2023-03-01 ENCOUNTER — Other Ambulatory Visit: Payer: Self-pay

## 2023-03-01 ENCOUNTER — Ambulatory Visit: Payer: Self-pay | Admitting: Pharmacist

## 2023-03-01 ENCOUNTER — Other Ambulatory Visit: Payer: Self-pay | Admitting: Pharmacist

## 2023-03-01 ENCOUNTER — Encounter: Payer: Self-pay | Admitting: Pharmacist

## 2023-03-01 DIAGNOSIS — Z79899 Other long term (current) drug therapy: Secondary | ICD-10-CM

## 2023-03-01 DIAGNOSIS — Z113 Encounter for screening for infections with a predominantly sexual mode of transmission: Secondary | ICD-10-CM

## 2023-03-02 ENCOUNTER — Other Ambulatory Visit: Payer: Self-pay

## 2023-03-02 ENCOUNTER — Other Ambulatory Visit (HOSPITAL_COMMUNITY): Payer: Self-pay

## 2023-03-02 ENCOUNTER — Other Ambulatory Visit: Payer: Self-pay | Admitting: Pharmacist

## 2023-03-02 DIAGNOSIS — Z7252 High risk homosexual behavior: Secondary | ICD-10-CM

## 2023-03-02 LAB — HIV ANTIBODY (ROUTINE TESTING W REFLEX): HIV 1&2 Ab, 4th Generation: NONREACTIVE

## 2023-03-02 LAB — RPR: RPR Ser Ql: NONREACTIVE

## 2023-03-02 MED ORDER — EMTRICITABINE-TENOFOVIR AF 200-25 MG PO TABS
1.0000 | ORAL_TABLET | Freq: Every day | ORAL | 2 refills | Status: DC
  Filled 2023-03-02 – 2023-03-05 (×2): qty 30, 30d supply, fill #0
  Filled 2023-03-31: qty 30, 30d supply, fill #1
  Filled 2023-04-27: qty 30, 30d supply, fill #2

## 2023-03-03 LAB — URINE CYTOLOGY ANCILLARY ONLY
Chlamydia: NEGATIVE
Comment: NEGATIVE
Comment: NORMAL
Neisseria Gonorrhea: NEGATIVE

## 2023-03-05 ENCOUNTER — Other Ambulatory Visit (HOSPITAL_COMMUNITY): Payer: Self-pay

## 2023-03-05 ENCOUNTER — Other Ambulatory Visit: Payer: Self-pay

## 2023-03-31 ENCOUNTER — Other Ambulatory Visit (HOSPITAL_COMMUNITY): Payer: Self-pay

## 2023-04-02 ENCOUNTER — Other Ambulatory Visit (HOSPITAL_COMMUNITY): Payer: Self-pay

## 2023-04-27 ENCOUNTER — Other Ambulatory Visit (HOSPITAL_COMMUNITY): Payer: Self-pay

## 2023-05-03 ENCOUNTER — Other Ambulatory Visit (HOSPITAL_COMMUNITY): Payer: Self-pay

## 2023-05-25 ENCOUNTER — Other Ambulatory Visit (HOSPITAL_COMMUNITY): Payer: Self-pay

## 2023-05-31 ENCOUNTER — Encounter: Payer: Self-pay | Admitting: Pharmacist

## 2023-06-01 ENCOUNTER — Ambulatory Visit (INDEPENDENT_AMBULATORY_CARE_PROVIDER_SITE_OTHER): Payer: Self-pay | Admitting: Pharmacist

## 2023-06-01 ENCOUNTER — Other Ambulatory Visit: Payer: Self-pay

## 2023-06-01 ENCOUNTER — Other Ambulatory Visit (HOSPITAL_COMMUNITY)
Admission: RE | Admit: 2023-06-01 | Discharge: 2023-06-01 | Disposition: A | Discharge status: Home / Self Care | Payer: Self-pay | Source: Ambulatory Visit | Attending: Infectious Disease | Admitting: Infectious Disease

## 2023-06-01 DIAGNOSIS — Z2981 Encounter for HIV pre-exposure prophylaxis: Secondary | ICD-10-CM

## 2023-06-01 DIAGNOSIS — Z113 Encounter for screening for infections with a predominantly sexual mode of transmission: Secondary | ICD-10-CM

## 2023-06-01 DIAGNOSIS — Z79899 Other long term (current) drug therapy: Secondary | ICD-10-CM

## 2023-06-01 NOTE — Progress Notes (Signed)
Date:  06/01/2023   HPI: Mataeo Persing is a 37 y.o. male who presents to the [facility] clinic for HIV PrEP follow-up.  Insured   []    Uninsured  [x]    There are no problems to display for this patient.   Patient's Medications  New Prescriptions   No medications on file  Previous Medications   ACYCLOVIR (ZOVIRAX) 400 MG TABLET    Take 400 mg by mouth 5 (five) times daily.   DOXYCYCLINE (VIBRAMYCIN) 100 MG CAPSULE    Take 1 capsule (100 mg total) by mouth 2 (two) times daily.   EMTRICITABINE-TENOFOVIR AF (DESCOVY) 200-25 MG TABLET    Take 1 tablet by mouth daily.  Modified Medications   No medications on file  Discontinued Medications   No medications on file       03/05/2020    3:33 PM  CHL HIV PREP FLOWSHEET RESULTS  Insurance Status Uninsured  Gender at birth Male  Gender identity cis-Male  Sex Partners Men only  # sex partners past 3-6 mos 1  Sex activity preferences Insertive and receptive  Condom use No  Treated for STI? Yes  HIV symptoms? Flu-like/Mono-like symptoms  PrEP Eligibility Yes    Labs:  SCr: Lab Results  Component Value Date   CREATININE 1.18 06/02/2022   CREATININE 0.96 06/04/2021   CREATININE 0.98 10/02/2020   CREATININE 1.10 04/08/2020   CREATININE 0.95 03/05/2020   HIV Lab Results  Component Value Date   HIV NON-REACTIVE 03/01/2023   HIV NON-REACTIVE 11/30/2022   HIV Non Reactive 10/09/2022   HIV NON-REACTIVE 09/03/2022   HIV NON-REACTIVE 06/02/2022   Hepatitis B Lab Results  Component Value Date   HEPBSAB REACTIVE (A) 03/05/2020   HEPBSAG NON-REACTIVE 03/05/2020   Hepatitis C Lab Results  Component Value Date   HEPCAB NON-REACTIVE 03/05/2020   Hepatitis A Lab Results  Component Value Date   HAV NON-REACTIVE 03/05/2020   RPR and STI Lab Results  Component Value Date   LABRPR NON-REACTIVE 03/01/2023   LABRPR NON-REACTIVE 11/30/2022   LABRPR Non Reactive 10/09/2022   LABRPR NON-REACTIVE 09/03/2022   LABRPR  NON-REACTIVE 06/02/2022    STI Results GC CT  03/01/2023  4:09 PM Negative  Negative   11/30/2022  4:07 PM Negative    Negative    Negative  Negative    Negative    Negative   10/22/2022 12:54 PM Negative  Negative   10/09/2022  3:13 PM Positive  Negative   09/03/2022 11:42 AM Negative    Negative    Negative  Negative    Negative    Negative   08/08/2022  4:47 PM Positive  Negative   06/02/2022  4:12 PM Negative    Negative    Negative  Negative    Negative    Negative   03/05/2022  3:01 PM Negative    Negative    Negative  Negative    Negative    Negative   09/03/2021 10:10 AM Negative    Negative    Negative  Negative    Negative    Negative   06/04/2021 10:18 AM Positive    Negative    Negative  Negative    Negative    Negative   04/03/2021  2:50 PM Negative    Negative  Negative    Negative   10/02/2020  4:04 PM Negative  Negative   03/05/2020  3:45 PM Negative    Negative  Negative    Negative  Assessment: Ausitn is here today for his 3 month PrEP follow up. Doing well on Descovy with no issues. Screened for acute HIV and STIs; denies any symptoms or exposures. No problems with [facility]. Will check lab work and see him back in 3 months.  Plan: - HIV antibody, RPR, BMP, lipid panel, and urine/rectal/pharyngeal GC/CT swabs for cytology today  - Descovy x 3 months if HIV negative - Follow up with me again on 08/24/23  Briarrose Shor L. Jannette Fogo, PharmD, BCIDP, AAHIVP, CPP Clinical Pharmacist Practitioner Infectious Diseases Clinical Pharmacist [facility] 06/01/2023, 4:09 PM ++

## 2023-06-02 ENCOUNTER — Other Ambulatory Visit: Payer: Self-pay

## 2023-06-02 ENCOUNTER — Other Ambulatory Visit (HOSPITAL_COMMUNITY): Payer: Self-pay

## 2023-06-02 ENCOUNTER — Other Ambulatory Visit: Payer: Self-pay | Admitting: Pharmacist

## 2023-06-02 DIAGNOSIS — Z7252 High risk homosexual behavior: Secondary | ICD-10-CM

## 2023-06-02 LAB — BASIC METABOLIC PANEL
BUN/Creatinine Ratio: 10 (calc) (ref 6–22)
BUN: 14 mg/dL (ref 7–25)
CO2: 25 mmol/L (ref 20–32)
Calcium: 9.2 mg/dL (ref 8.6–10.3)
Chloride: 106 mmol/L (ref 98–110)
Creat: 1.45 mg/dL — ABNORMAL HIGH (ref 0.60–1.26)
Glucose, Bld: 103 mg/dL — ABNORMAL HIGH (ref 65–99)
Potassium: 4 mmol/L (ref 3.5–5.3)
Sodium: 139 mmol/L (ref 135–146)

## 2023-06-02 LAB — HIV ANTIBODY (ROUTINE TESTING W REFLEX): HIV 1&2 Ab, 4th Generation: NONREACTIVE

## 2023-06-02 LAB — LIPID PANEL
Cholesterol: 111 mg/dL (ref <200)
HDL: 34 mg/dL — ABNORMAL LOW (ref >=40)
LDL Cholesterol (Calc): 60 mg/dL
Non-HDL Cholesterol (Calc): 77 mg/dL (ref <130)
Total CHOL/HDL Ratio: 3.3 (calc) (ref <5.0)
Triglycerides: 89 mg/dL (ref <150)

## 2023-06-02 LAB — RPR: RPR Ser Ql: NONREACTIVE

## 2023-06-02 MED ORDER — EMTRICITABINE-TENOFOVIR AF 200-25 MG PO TABS
1.0000 | ORAL_TABLET | Freq: Every day | ORAL | 2 refills | Status: DC
  Filled 2023-06-02: qty 30, 30d supply, fill #0
  Filled 2023-06-29: qty 30, 30d supply, fill #1
  Filled 2023-07-27: qty 30, 30d supply, fill #2

## 2023-06-03 LAB — CYTOLOGY, (ORAL, ANAL, URETHRAL) ANCILLARY ONLY
Chlamydia: NEGATIVE
Chlamydia: NEGATIVE
Comment: NEGATIVE
Comment: NEGATIVE
Comment: NORMAL
Comment: NORMAL
Neisseria Gonorrhea: NEGATIVE
Neisseria Gonorrhea: NEGATIVE

## 2023-06-03 LAB — URINE CYTOLOGY ANCILLARY ONLY
Chlamydia: NEGATIVE
Comment: NEGATIVE
Comment: NORMAL
Neisseria Gonorrhea: NEGATIVE

## 2023-06-04 ENCOUNTER — Other Ambulatory Visit (HOSPITAL_COMMUNITY): Payer: Self-pay

## 2023-06-29 ENCOUNTER — Other Ambulatory Visit: Payer: Self-pay

## 2023-06-29 NOTE — Progress Notes (Signed)
Specialty Pharmacy Refill Coordination Note  Dream Nodal is a 37 y.o. male contacted today regarding refills of specialty medication(s) Emtricitabine-Tenofovir Af   Patient requested Delivery   Delivery date: 07/05/23   Verified address: [address], [place], [address]   Medication will be filled on 07/02/2023.

## 2023-07-02 ENCOUNTER — Other Ambulatory Visit: Payer: Self-pay

## 2023-07-27 ENCOUNTER — Other Ambulatory Visit: Payer: Self-pay

## 2023-07-27 NOTE — Progress Notes (Signed)
Specialty Pharmacy Refill Coordination Note  Ian Humphrey is a 37 y.o. male contacted today regarding refills of specialty medication(s) Emtricitabine-Tenofovir Af   Patient requested Delivery   Delivery date: 08/02/23   Verified address: [address], [place], [address]   Medication will be filled on 07/30/23.

## 2023-07-30 ENCOUNTER — Other Ambulatory Visit: Payer: Self-pay

## 2023-08-12 ENCOUNTER — Other Ambulatory Visit: Payer: Self-pay

## 2023-08-23 ENCOUNTER — Other Ambulatory Visit (HOSPITAL_COMMUNITY): Payer: Self-pay

## 2023-08-23 NOTE — Progress Notes (Unsigned)
HPI: Styles Hennessee is a 37 y.o. male who presents to the [facility] clinic for HIV PrEP follow-up.  Insured   []    Uninsured  [x]    There are no problems to display for this patient.   Patient's Medications  New Prescriptions   No medications on file  Previous Medications   ACYCLOVIR (ZOVIRAX) 400 MG TABLET    Take 400 mg by mouth 5 (five) times daily.   DOXYCYCLINE (VIBRAMYCIN) 100 MG CAPSULE    Take 1 capsule (100 mg total) by mouth 2 (two) times daily.   EMTRICITABINE-TENOFOVIR AF (DESCOVY) 200-25 MG TABLET    Take 1 tablet by mouth daily.  Modified Medications   No medications on file  Discontinued Medications   No medications on file       03/05/2020    3:33 PM  CHL HIV PREP FLOWSHEET RESULTS  Insurance Status Uninsured  Gender at birth Male  Gender identity cis-Male  Sex Partners Men only  # sex partners past 3-6 mos 1  Sex activity preferences Insertive and receptive  Condom use No  Treated for STI? Yes  HIV symptoms? Flu-like/Mono-like symptoms  PrEP Eligibility Yes    Labs:  SCr: Lab Results  Component Value Date   CREATININE 1.45 (H) 06/01/2023   CREATININE 1.18 06/02/2022   CREATININE 0.96 06/04/2021   CREATININE 0.98 10/02/2020   CREATININE 1.10 04/08/2020   HIV Lab Results  Component Value Date   HIV NON-REACTIVE 06/01/2023   HIV NON-REACTIVE 03/01/2023   HIV NON-REACTIVE 11/30/2022   HIV Non Reactive 10/09/2022   HIV NON-REACTIVE 09/03/2022   Hepatitis B Lab Results  Component Value Date   HEPBSAB REACTIVE (A) 03/05/2020   HEPBSAG NON-REACTIVE 03/05/2020   Hepatitis C Lab Results  Component Value Date   HEPCAB NON-REACTIVE 03/05/2020   Hepatitis A Lab Results  Component Value Date   HAV NON-REACTIVE 03/05/2020   RPR and STI Lab Results  Component Value Date   LABRPR NON-REACTIVE 06/01/2023   LABRPR NON-REACTIVE 03/01/2023   LABRPR NON-REACTIVE 11/30/2022   LABRPR Non Reactive 10/09/2022   LABRPR NON-REACTIVE  09/03/2022    STI Results GC CT  06/01/2023  4:19 PM Negative    Negative    Negative  Negative    Negative    Negative   03/01/2023  4:09 PM Negative  Negative   11/30/2022  4:07 PM Negative    Negative    Negative  Negative    Negative    Negative   10/22/2022 12:54 PM Negative  Negative   10/09/2022  3:13 PM Positive  Negative   09/03/2022 11:42 AM Negative    Negative    Negative  Negative    Negative    Negative   08/08/2022  4:47 PM Positive  Negative   06/02/2022  4:12 PM Negative    Negative    Negative  Negative    Negative    Negative   03/05/2022  3:01 PM Negative    Negative    Negative  Negative    Negative    Negative   09/03/2021 10:10 AM Negative    Negative    Negative  Negative    Negative    Negative   06/04/2021 10:18 AM Positive    Negative    Negative  Negative    Negative    Negative   04/03/2021  2:50 PM Negative    Negative  Negative    Negative   10/02/2020  4:04 PM  Negative  Negative   03/05/2020  3:45 PM Negative    Negative  Negative    Negative     Assessment: Corkey is here today for his 3 month PrEP follow up. Doing well on Descovy with no issues. Screened for acute HIV and STIs; denies any symptoms or exposures. No problems with [facility]. Will check lab work and see him back in 3 months.  Immunizations: Due for Tdap, COVID, Influenza, HAV  Needs to sign up for medicaid   Plan: - HIV antibody, RPR, BMP, lipid panel, and urine/rectal/pharyngeal GC/CT swabs for cytology today  - Descovy x 3 months if HIV negative - Follow up with me again on 08/24/23  Lora Paula, PharmD PGY-2 Infectious Diseases Pharmacy Resident [facility]

## 2023-08-24 ENCOUNTER — Other Ambulatory Visit (HOSPITAL_COMMUNITY): Payer: Self-pay

## 2023-08-24 ENCOUNTER — Other Ambulatory Visit: Payer: Self-pay

## 2023-08-24 ENCOUNTER — Ambulatory Visit (INDEPENDENT_AMBULATORY_CARE_PROVIDER_SITE_OTHER): Payer: Self-pay | Admitting: Pharmacist

## 2023-08-24 DIAGNOSIS — Z113 Encounter for screening for infections with a predominantly sexual mode of transmission: Secondary | ICD-10-CM

## 2023-08-24 DIAGNOSIS — Z2981 Encounter for HIV pre-exposure prophylaxis: Secondary | ICD-10-CM

## 2023-08-25 ENCOUNTER — Other Ambulatory Visit (HOSPITAL_COMMUNITY): Payer: Self-pay

## 2023-08-25 LAB — RPR: RPR Ser Ql: NONREACTIVE

## 2023-08-25 LAB — HIV ANTIBODY (ROUTINE TESTING W REFLEX): HIV 1&2 Ab, 4th Generation: NONREACTIVE

## 2023-08-26 ENCOUNTER — Other Ambulatory Visit: Payer: Self-pay

## 2023-08-26 ENCOUNTER — Other Ambulatory Visit (HOSPITAL_COMMUNITY): Payer: Self-pay

## 2023-08-26 DIAGNOSIS — Z7252 High risk homosexual behavior: Secondary | ICD-10-CM

## 2023-08-26 LAB — CYTOLOGY, (ORAL, ANAL, URETHRAL) ANCILLARY ONLY
Chlamydia: NEGATIVE
Chlamydia: NEGATIVE
Comment: NEGATIVE
Comment: NEGATIVE
Comment: NORMAL
Comment: NORMAL
Neisseria Gonorrhea: NEGATIVE
Neisseria Gonorrhea: NEGATIVE

## 2023-08-26 LAB — URINE CYTOLOGY ANCILLARY ONLY
Chlamydia: NEGATIVE
Comment: NEGATIVE
Comment: NORMAL
Neisseria Gonorrhea: NEGATIVE

## 2023-08-26 MED ORDER — EMTRICITABINE-TENOFOVIR AF 200-25 MG PO TABS
1.0000 | ORAL_TABLET | Freq: Every day | ORAL | 2 refills | Status: DC
  Filled 2023-08-26 – 2023-09-13 (×2): qty 30, 30d supply, fill #0
  Filled 2023-10-05: qty 30, 30d supply, fill #1
  Filled 2023-10-28: qty 30, 30d supply, fill #2

## 2023-08-26 MED ORDER — EMTRICITABINE-TENOFOVIR AF 200-25 MG PO TABS: 1.0000 | ORAL_TABLET | Freq: Every day | ORAL | 2 refills | Status: DC

## 2023-08-26 NOTE — Addendum Note (Signed)
Addended by: Dalene Carrow on: 08/26/2023 02:15 PM   Modules accepted: Orders

## 2023-08-27 ENCOUNTER — Other Ambulatory Visit (HOSPITAL_COMMUNITY): Payer: Self-pay

## 2023-08-27 ENCOUNTER — Encounter (HOSPITAL_COMMUNITY): Payer: Self-pay

## 2023-08-30 ENCOUNTER — Other Ambulatory Visit: Payer: Self-pay

## 2023-09-13 ENCOUNTER — Other Ambulatory Visit: Payer: Self-pay

## 2023-09-30 ENCOUNTER — Other Ambulatory Visit (HOSPITAL_COMMUNITY): Payer: Self-pay

## 2023-09-30 ENCOUNTER — Other Ambulatory Visit: Payer: Self-pay

## 2023-09-30 NOTE — Progress Notes (Signed)
 Inadvertently processed refill in December without completing assessment. Moving refill call to next appropriate date.

## 2023-10-05 ENCOUNTER — Other Ambulatory Visit: Payer: Self-pay

## 2023-10-05 NOTE — Progress Notes (Signed)
 Specialty Pharmacy Refill Coordination Note  Ian Humphrey is a 39 y.o. male contacted today regarding refills of specialty medication(s) Emtricitabine -Tenofovir  AF (DESCOVY )   Patient requested Delivery   Delivery date: 10/11/23   Verified address: [address]   [place] [address]   Medication will be filled on 10/08/23.

## 2023-10-08 ENCOUNTER — Other Ambulatory Visit (HOSPITAL_COMMUNITY): Payer: Self-pay

## 2023-10-08 ENCOUNTER — Other Ambulatory Visit: Payer: Self-pay

## 2023-10-21 ENCOUNTER — Other Ambulatory Visit (HOSPITAL_COMMUNITY): Payer: Self-pay

## 2023-10-27 ENCOUNTER — Other Ambulatory Visit (HOSPITAL_COMMUNITY): Payer: Self-pay

## 2023-10-28 ENCOUNTER — Other Ambulatory Visit (HOSPITAL_COMMUNITY): Payer: Self-pay | Admitting: Pharmacy Technician

## 2023-10-28 ENCOUNTER — Other Ambulatory Visit (HOSPITAL_COMMUNITY): Payer: Self-pay

## 2023-10-28 ENCOUNTER — Other Ambulatory Visit: Payer: Self-pay

## 2023-10-28 NOTE — Progress Notes (Signed)
 Specialty Pharmacy Refill Coordination Note  Ian Humphrey is a 38 y.o. male contacted today regarding refills of specialty medication(s) Emtricitabine -Tenofovir  AF (DESCOVY )   Patient requested Delivery   Delivery date: 11/05/23   Verified address: [address] [place]   Medication will be filled on 11/04/23.

## 2023-10-29 ENCOUNTER — Other Ambulatory Visit: Payer: Self-pay

## 2023-11-04 ENCOUNTER — Other Ambulatory Visit (HOSPITAL_COMMUNITY): Payer: Self-pay

## 2023-11-22 NOTE — Progress Notes (Unsigned)
 HPI: Ian Humphrey is a 38 y.o. male who presents to the [facility] pharmacy clinic for HIV PrEP follow-up.  Insured   []    Uninsured  [x]    There are no active problems to display for this patient.   Patient's Medications  New Prescriptions   No medications on file  Previous Medications   ACYCLOVIR (ZOVIRAX) 400 MG TABLET    Take 400 mg by mouth 5 (five) times daily.   DOXYCYCLINE (VIBRAMYCIN) 100 MG CAPSULE    Take 1 capsule (100 mg total) by mouth 2 (two) times daily.   EMTRICITABINE-TENOFOVIR AF (DESCOVY) 200-25 MG TABLET    Take 1 tablet by mouth daily.  Modified Medications   No medications on file  Discontinued Medications   No medications on file       03/05/2020    3:33 PM  [facility] HIV PREP FLOWSHEET RESULTS  Insurance Status Uninsured  Gender at birth Male  Gender identity cis-Male  Sex Partners Men only  # sex partners past 3-6 mos 1  Sex activity preferences Insertive and receptive  Condom use No  Treated for STI? Yes  HIV symptoms? Flu-like/Mono-like symptoms  PrEP Eligibility Yes    Labs:  SCr: Lab Results  Component Value Date   CREATININE 1.45 (H) 06/01/2023   CREATININE 1.18 06/02/2022   CREATININE 0.96 06/04/2021   CREATININE 0.98 10/02/2020   CREATININE 1.10 04/08/2020   HIV Lab Results  Component Value Date   HIV NON-REACTIVE 08/24/2023   HIV NON-REACTIVE 06/01/2023   HIV NON-REACTIVE 03/01/2023   HIV NON-REACTIVE 11/30/2022   HIV Non Reactive 10/09/2022   Hepatitis B Lab Results  Component Value Date   HEPBSAB REACTIVE (A) 03/05/2020   HEPBSAG NON-REACTIVE 03/05/2020   Hepatitis C Lab Results  Component Value Date   HEPCAB NON-REACTIVE 03/05/2020   Hepatitis A Lab Results  Component Value Date   HAV NON-REACTIVE 03/05/2020   RPR and STI Lab Results  Component Value Date   LABRPR NON-REACTIVE 08/24/2023   LABRPR NON-REACTIVE 06/01/2023   LABRPR NON-REACTIVE 03/01/2023   LABRPR NON-REACTIVE 11/30/2022   LABRPR Non Reactive  10/09/2022    STI Results GC CT  08/24/2023  4:19 PM Negative    Negative    Negative  Negative    Negative    Negative   06/01/2023  4:19 PM Negative    Negative    Negative  Negative    Negative    Negative   03/01/2023  4:09 PM Negative  Negative   11/30/2022  4:07 PM Negative    Negative    Negative  Negative    Negative    Negative   10/22/2022 12:54 PM Negative  Negative   10/09/2022  3:13 PM Positive  Negative   09/03/2022 11:42 AM Negative    Negative    Negative  Negative    Negative    Negative   08/08/2022  4:47 PM Positive  Negative   06/02/2022  4:12 PM Negative    Negative    Negative  Negative    Negative    Negative   03/05/2022  3:01 PM Negative    Negative    Negative  Negative    Negative    Negative   09/03/2021 10:10 AM Negative    Negative    Negative  Negative    Negative    Negative   06/04/2021 10:18 AM Positive    Negative    Negative  Negative    Negative  Negative   04/03/2021  2:50 PM Negative    Negative  Negative    Negative   10/02/2020  4:04 PM Negative  Negative   03/05/2020  3:45 PM Negative    Negative  Negative    Negative     Assessment: Ian Humphrey is here today to follow up for HIV PrEP. Doing well on Descovy without any issues. Fills on time at [facility]. He and his partner are having a baby girl via surrogacy; due on June 9th. The surrogate is his partner's sister, and he is so excited. Baby girl's name will be Ian Humphrey. Since his next follow up is scheduled right at his surrogate's due date will push out 4 months instead of 3.    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 Will let [name] know to keep an eye out with his refills.   Screened for acute HIV symptoms such as fatigue, muscle aches, rash, sore throat, lymphadenopathy, headache, night sweats, nausea/vomiting/diarrhea, and fever. Denies any  symptoms. No new partners but requesting full STI testing today. No other issues. Will check labs and see him back in 4 months around the beginning of July.    Plan: - HIV antibody, RPR, and urine/rectal/pharyngeal cytologies for GC/chlamydia today - Descovy x 3 months if HIV negative - Follow up with me on 03/27/24  Ian Humphrey L. Mercedees Convery, PharmD, BCIDP, AAHIVP, CPP Clinical Pharmacist Practitioner Infectious Diseases Clinical Pharmacist [facility] 11/22/2023, 3:53 PM

## 2023-11-23 ENCOUNTER — Ambulatory Visit (INDEPENDENT_AMBULATORY_CARE_PROVIDER_SITE_OTHER): Payer: Self-pay | Admitting: Pharmacist

## 2023-11-23 ENCOUNTER — Other Ambulatory Visit: Payer: Self-pay

## 2023-11-23 DIAGNOSIS — Z2981 Encounter for HIV pre-exposure prophylaxis: Secondary | ICD-10-CM

## 2023-11-23 DIAGNOSIS — Z113 Encounter for screening for infections with a predominantly sexual mode of transmission: Secondary | ICD-10-CM

## 2023-11-24 ENCOUNTER — Other Ambulatory Visit: Payer: Self-pay

## 2023-11-24 LAB — URINE CYTOLOGY ANCILLARY ONLY
Chlamydia: NEGATIVE
Comment: NEGATIVE
Comment: NORMAL
Neisseria Gonorrhea: NEGATIVE

## 2023-11-24 LAB — CYTOLOGY, (ORAL, ANAL, URETHRAL) ANCILLARY ONLY
Chlamydia: NEGATIVE
Chlamydia: NEGATIVE
Comment: NEGATIVE
Comment: NEGATIVE
Comment: NORMAL
Comment: NORMAL
Neisseria Gonorrhea: NEGATIVE
Neisseria Gonorrhea: NEGATIVE

## 2023-11-24 LAB — HIV ANTIBODY (ROUTINE TESTING W REFLEX): HIV 1&2 Ab, 4th Generation: NONREACTIVE

## 2023-11-24 LAB — RPR: RPR Ser Ql: NONREACTIVE

## 2023-11-25 ENCOUNTER — Other Ambulatory Visit: Payer: Self-pay

## 2023-11-25 ENCOUNTER — Other Ambulatory Visit: Payer: Self-pay | Admitting: Pharmacist

## 2023-11-25 ENCOUNTER — Other Ambulatory Visit (HOSPITAL_COMMUNITY): Payer: Self-pay

## 2023-11-25 DIAGNOSIS — Z7252 High risk homosexual behavior: Secondary | ICD-10-CM

## 2023-11-25 MED ORDER — EMTRICITABINE-TENOFOVIR AF 200-25 MG PO TABS
1.0000 | ORAL_TABLET | Freq: Every day | ORAL | 3 refills | Status: DC
  Filled 2023-11-25 – 2023-11-29 (×2): qty 30, 30d supply, fill #0
  Filled 2023-12-28: qty 30, 30d supply, fill #1

## 2023-11-29 ENCOUNTER — Other Ambulatory Visit: Payer: Self-pay

## 2023-11-29 NOTE — Progress Notes (Signed)
 Specialty Pharmacy Ongoing Clinical Assessment Note  Ian Humphrey is a 38 y.o. male who is being followed by the specialty pharmacy service for RxSp HIV PrEP   Patient's specialty medication(s) reviewed today: Emtricitabine-Tenofovir AF (DESCOVY)   Missed doses in the last 4 weeks: 0   Patient/Caregiver did not have any additional questions or concerns.   Therapeutic benefit summary: Patient is achieving benefit   Adverse events/side effects summary: No adverse events/side effects   Patient's therapy is appropriate to: Continue    Goals Addressed             This Visit's Progress    Maintain optimal adherence to therapy       Patient is on track. Patient will maintain adherence         Follow up:  6 months  Otto Herb Specialty Pharmacist

## 2023-11-29 NOTE — Progress Notes (Signed)
 Specialty Pharmacy Refill Coordination Note  Ian Humphrey is a 38 y.o. male contacted today regarding refills of specialty medication(s) Emtricitabine-Tenofovir AF (DESCOVY)   Patient requested Delivery   Delivery date: 12/03/23   Verified address: [address] [place]   Medication will be filled on 12/02/23.

## 2023-12-02 ENCOUNTER — Other Ambulatory Visit: Payer: Self-pay

## 2023-12-21 ENCOUNTER — Other Ambulatory Visit: Payer: Self-pay

## 2023-12-24 ENCOUNTER — Other Ambulatory Visit: Payer: Self-pay

## 2023-12-28 ENCOUNTER — Other Ambulatory Visit: Payer: Self-pay

## 2023-12-28 NOTE — Progress Notes (Signed)
 Specialty Pharmacy Refill Coordination Note  Ian Humphrey is a 38 y.o. male contacted today regarding refills of specialty medication(s) Emtricitabine-Tenofovir AF (DESCOVY)   Patient requested (Patient-Rptd) Delivery   Delivery date: (Patient-Rptd) 01/03/24   Verified address: (Patient-Rptd) [address]   Medication will be filled on 04.11.25.

## 2023-12-31 ENCOUNTER — Telehealth: Payer: Self-pay

## 2023-12-31 ENCOUNTER — Other Ambulatory Visit: Payer: Self-pay | Admitting: Pharmacist

## 2023-12-31 ENCOUNTER — Other Ambulatory Visit (HOSPITAL_COMMUNITY): Payer: Self-pay

## 2023-12-31 ENCOUNTER — Other Ambulatory Visit: Payer: Self-pay

## 2023-12-31 DIAGNOSIS — Z7252 High risk homosexual behavior: Secondary | ICD-10-CM

## 2023-12-31 MED ORDER — EMTRICITABINE-TENOFOVIR AF 200-25 MG PO TABS
1.0000 | ORAL_TABLET | Freq: Every day | ORAL | 2 refills | Status: DC
  Filled 2023-12-31: qty 30, 30d supply, fill #0
  Filled 2024-01-31: qty 30, 30d supply, fill #1
  Filled 2024-02-25: qty 30, 30d supply, fill #2

## 2023-12-31 NOTE — Telephone Encounter (Addendum)
 Waiting for Pay stubs from patient to enroll him into the [employer] PAP

## 2023-12-31 NOTE — Progress Notes (Signed)
 Rx [employer] for [facility] Rejecting Patient ID not Covered. Routed to Doona D. [facility] Jacques Earth[facility].

## 2023-12-31 NOTE — Telephone Encounter (Signed)
 [facility] Patient Advocate Encounter  Completed and sent [employer] Advancing Access application for DESCOVY for this patient who is uninsured.    Patient assistance phone number for follow up is [phone].   This encounter will be updated until final determination.   Kae Heller CPhT Specialty Pharmacy Patient [facility] Phone: [phone] Fax:  [phone]

## 2023-12-31 NOTE — Telephone Encounter (Signed)
 [facility] Patient Advocate Encounter   Completed and sent [employer] Advancing Access application for Descovy for this patient who is uninsured.     Patient is approved 12/31/23 through 12/30/24   BIN      [id number] PCN    [id number] GRP    [id number] ID        [id number]     Kae Heller, CPhT Specialty Pharmacy Patient [facility] for Infectious Disease Phone: [phone] Fax:  [phone]

## 2024-01-05 ENCOUNTER — Other Ambulatory Visit (HOSPITAL_COMMUNITY): Payer: Self-pay

## 2024-01-24 ENCOUNTER — Other Ambulatory Visit: Payer: Self-pay

## 2024-01-28 ENCOUNTER — Other Ambulatory Visit: Payer: Self-pay

## 2024-01-31 ENCOUNTER — Other Ambulatory Visit: Payer: Self-pay | Admitting: Pharmacy Technician

## 2024-01-31 ENCOUNTER — Other Ambulatory Visit: Payer: Self-pay

## 2024-01-31 NOTE — Progress Notes (Signed)
 Specialty Pharmacy Refill Coordination Note  Ian Humphrey is a 38 y.o. male contacted today regarding refills of specialty medication(s) Emtricitabine -Tenofovir  AF (DESCOVY )   Patient requested Delivery   Delivery date: 02/04/24   Verified address: [address]  [place]   Medication will be filled on 02/03/24.

## 2024-02-03 ENCOUNTER — Other Ambulatory Visit (HOSPITAL_COMMUNITY): Payer: Self-pay

## 2024-02-03 ENCOUNTER — Other Ambulatory Visit: Payer: Self-pay

## 2024-02-25 ENCOUNTER — Other Ambulatory Visit: Payer: Self-pay

## 2024-02-25 ENCOUNTER — Encounter (INDEPENDENT_AMBULATORY_CARE_PROVIDER_SITE_OTHER): Payer: Self-pay

## 2024-02-25 NOTE — Progress Notes (Signed)
 Specialty Pharmacy Refill Coordination Note  Ian Humphrey is a 38 y.o. male contacted today regarding refills of specialty medication(s) Emtricitabine -Tenofovir  AF (DESCOVY )   Patient requested Delivery   Delivery date: 03/03/24 Verified address: [address], [place], [place], [address]   Medication will be filled on 03/02/24.  New insurance added to profile

## 2024-03-01 ENCOUNTER — Other Ambulatory Visit: Payer: Self-pay

## 2024-03-02 ENCOUNTER — Other Ambulatory Visit (HOSPITAL_COMMUNITY): Payer: Self-pay

## 2024-03-03 ENCOUNTER — Other Ambulatory Visit: Payer: Self-pay

## 2024-03-21 ENCOUNTER — Other Ambulatory Visit: Payer: Self-pay

## 2024-03-22 ENCOUNTER — Other Ambulatory Visit (HOSPITAL_COMMUNITY): Payer: Self-pay

## 2024-03-22 NOTE — Progress Notes (Signed)
 HPI: Ian Humphrey is a 38 y.o. male who presents to the [facility] pharmacy clinic for HIV PrEP follow-up.  Insured   [x]    Uninsured  []    There are no active problems to display for this patient.   Patient's Medications  New Prescriptions   No medications on file  Previous Medications   ACYCLOVIR (ZOVIRAX) 400 MG TABLET    Take 400 mg by mouth 5 (five) times daily.   DOXYCYCLINE  (VIBRAMYCIN ) 100 MG CAPSULE    Take 1 capsule (100 mg total) by mouth 2 (two) times daily.   EMTRICITABINE -TENOFOVIR  AF (DESCOVY ) 200-25 MG TABLET    Take 1 tablet by mouth daily.  Modified Medications   No medications on file  Discontinued Medications   No medications on file       03/05/2020    3:33 PM  [facility] HIV PREP FLOWSHEET RESULTS  Insurance Status Uninsured  Gender at birth Male  Gender identity cis-Male  Sex Partners Men only  # sex partners past 3-6 mos 1  Sex activity preferences Insertive and receptive  Condom use No  Treated for STI? Yes  HIV symptoms? Flu-like/Mono-like symptoms  PrEP Eligibility Yes    Labs:  SCr: Lab Results  Component Value Date   CREATININE 1.45 (H) 06/01/2023   CREATININE 1.18 06/02/2022   CREATININE 0.96 06/04/2021   CREATININE 0.98 10/02/2020   CREATININE 1.10 04/08/2020   HIV Lab Results  Component Value Date   HIV NON-REACTIVE 11/23/2023   HIV NON-REACTIVE 08/24/2023   HIV NON-REACTIVE 06/01/2023   HIV NON-REACTIVE 03/01/2023   HIV NON-REACTIVE 11/30/2022   Hepatitis B Lab Results  Component Value Date   HEPBSAB REACTIVE (A) 03/05/2020   HEPBSAG NON-REACTIVE 03/05/2020   Hepatitis C Lab Results  Component Value Date   HEPCAB NON-REACTIVE 03/05/2020   Hepatitis A Lab Results  Component Value Date   HAV NON-REACTIVE 03/05/2020   RPR and STI Lab Results  Component Value Date   LABRPR NON-REACTIVE 11/23/2023   LABRPR NON-REACTIVE 08/24/2023   LABRPR NON-REACTIVE 06/01/2023   LABRPR NON-REACTIVE 03/01/2023   LABRPR NON-REACTIVE  11/30/2022    STI Results GC CT  11/23/2023  3:40 PM Negative    Negative    Negative  Negative    Negative    Negative   08/24/2023  4:19 PM Negative    Negative    Negative  Negative    Negative    Negative   06/01/2023  4:19 PM Negative    Negative    Negative  Negative    Negative    Negative   03/01/2023  4:09 PM Negative  Negative   11/30/2022  4:07 PM Negative    Negative    Negative  Negative    Negative    Negative   10/22/2022 12:54 PM Negative  Negative   10/09/2022  3:13 PM Positive  Negative   09/03/2022 11:42 AM Negative    Negative    Negative  Negative    Negative    Negative   08/08/2022  4:47 PM Positive  Negative   06/02/2022  4:12 PM Negative    Negative    Negative  Negative    Negative    Negative   03/05/2022  3:01 PM Negative    Negative    Negative  Negative    Negative    Negative   09/03/2021 10:10 AM Negative    Negative    Negative  Negative    Negative  Negative   06/04/2021 10:18 AM Positive    Negative    Negative  Negative    Negative    Negative   04/03/2021  2:50 PM Negative    Negative  Negative    Negative   10/02/2020  4:04 PM Negative  Negative   03/05/2020  3:45 PM Negative    Negative  Negative    Negative     Assessment: Ian Humphrey is here for HIV PrEP follow up. Doing well on Descovy  and taking it every day. He is now a father to a little girl via surrogacy born on June 8th. He is now insured and must fill his Descovy  at [facility]. Provided him with their phone number today. Screened for acute HIV symptoms such as fatigue, muscle aches, rash, sore throat, lymphadenopathy, headache, night sweats, nausea/vomiting/diarrhea, and fever. Denies any symptoms. Agrees to STI testing today.   Now that he is insured, he is eligible for the HPV and Hepatitis A vaccines. He is very interested in the HPV vaccine and will start this at his next appointment. Will think about Hepatitis A. All questions answered.    Plan: - HIV antibody, RPR, and urine/rectal/pharyngeal cytologies for GC/chlamydia  - Descovy  x 3 months if HIV negative - Follow up me on 06/26/24  Joeanthony Seeling L. Xaivier Malay, PharmD, BCIDP, AAHIVP, CPP Clinical Pharmacist Practitioner - Infectious Diseases Clinical Pharmacist Lead - Specialty Pharmacy [facility] 03/22/2024, 3:39 PM

## 2024-03-27 ENCOUNTER — Other Ambulatory Visit: Payer: Self-pay

## 2024-03-27 ENCOUNTER — Ambulatory Visit (INDEPENDENT_AMBULATORY_CARE_PROVIDER_SITE_OTHER): Payer: Self-pay | Admitting: Pharmacist

## 2024-03-27 ENCOUNTER — Other Ambulatory Visit (HOSPITAL_COMMUNITY)
Admission: RE | Admit: 2024-03-27 | Discharge: 2024-03-27 | Disposition: A | Discharge status: Home / Self Care | Source: Ambulatory Visit | Attending: Infectious Disease | Admitting: Infectious Disease

## 2024-03-27 ENCOUNTER — Other Ambulatory Visit (HOSPITAL_COMMUNITY): Payer: Self-pay

## 2024-03-27 DIAGNOSIS — Z113 Encounter for screening for infections with a predominantly sexual mode of transmission: Secondary | ICD-10-CM

## 2024-03-27 DIAGNOSIS — Z2981 Encounter for HIV pre-exposure prophylaxis: Secondary | ICD-10-CM | POA: Insufficient documentation

## 2024-03-27 NOTE — Patient Instructions (Signed)
 [facility] [phone]

## 2024-03-28 ENCOUNTER — Other Ambulatory Visit: Payer: Self-pay | Admitting: Pharmacist

## 2024-03-28 DIAGNOSIS — Z7252 High risk homosexual behavior: Secondary | ICD-10-CM

## 2024-03-28 LAB — HIV ANTIBODY (ROUTINE TESTING W REFLEX): HIV 1&2 Ab, 4th Generation: NONREACTIVE

## 2024-03-28 LAB — RPR: RPR Ser Ql: NONREACTIVE

## 2024-03-28 MED ORDER — EMTRICITABINE-TENOFOVIR AF 200-25 MG PO TABS: 1.0000 | ORAL_TABLET | Freq: Every day | ORAL | 2 refills | Status: DC

## 2024-03-29 ENCOUNTER — Other Ambulatory Visit: Payer: Self-pay

## 2024-03-29 LAB — CYTOLOGY, (ORAL, ANAL, URETHRAL) ANCILLARY ONLY
Chlamydia: NEGATIVE
Chlamydia: NEGATIVE
Comment: NEGATIVE
Comment: NEGATIVE
Comment: NORMAL
Comment: NORMAL
Neisseria Gonorrhea: NEGATIVE
Neisseria Gonorrhea: NEGATIVE

## 2024-03-29 LAB — URINE CYTOLOGY ANCILLARY ONLY
Chlamydia: NEGATIVE
Comment: NEGATIVE
Comment: NORMAL
Neisseria Gonorrhea: NEGATIVE

## 2024-04-14 ENCOUNTER — Other Ambulatory Visit: Payer: Self-pay

## 2024-04-17 ENCOUNTER — Other Ambulatory Visit: Payer: Self-pay

## 2024-04-17 ENCOUNTER — Other Ambulatory Visit (HOSPITAL_COMMUNITY): Payer: Self-pay

## 2024-04-17 NOTE — Progress Notes (Signed)
 Patient have new insurance and have to fill with [facility].

## 2024-06-05 ENCOUNTER — Other Ambulatory Visit: Payer: Self-pay | Admitting: Pharmacist

## 2024-06-05 DIAGNOSIS — Z7252 High risk homosexual behavior: Secondary | ICD-10-CM

## 2024-06-14 ENCOUNTER — Other Ambulatory Visit: Payer: Self-pay | Admitting: Pharmacist

## 2024-06-14 DIAGNOSIS — Z7252 High risk homosexual behavior: Secondary | ICD-10-CM

## 2024-06-23 NOTE — Progress Notes (Unsigned)
 HPI: Ian Humphrey is a 38 y.o. male who presents to the [facility] clinic for HIV PrEP follow-up.  Insured   [x]    Uninsured  []    There are no active problems to display for this patient.   Patient's Medications  New Prescriptions   No medications on file  Previous Medications   ACYCLOVIR (ZOVIRAX) 400 MG TABLET    Take 400 mg by mouth 5 (five) times daily.   DOXYCYCLINE  (VIBRAMYCIN ) 100 MG CAPSULE    Take 1 capsule (100 mg total) by mouth 2 (two) times daily.   EMTRICITABINE -TENOFOVIR  AF (DESCOVY ) 200-25 MG TABLET    Take 1 tablet by mouth daily.  Modified Medications   No medications on file  Discontinued Medications   No medications on file       03/05/2020    3:33 PM  [facility] HIV PREP FLOWSHEET RESULTS  Insurance Status Uninsured  Gender at birth Male  Gender identity cis-Male  Sex Partners Men only  # sex partners past 3-6 mos 1  Sex activity preferences Insertive and receptive  Condom use No  Treated for STI? Yes  HIV symptoms? Flu-like/Mono-like symptoms  PrEP Eligibility Yes    Labs:  SCr: Lab Results  Component Value Date   CREATININE 1.45 (H) 06/01/2023   CREATININE 1.18 06/02/2022   CREATININE 0.96 06/04/2021   CREATININE 0.98 10/02/2020   CREATININE 1.10 04/08/2020   HIV Lab Results  Component Value Date   HIV NON-REACTIVE 03/27/2024   HIV NON-REACTIVE 11/23/2023   HIV NON-REACTIVE 08/24/2023   HIV NON-REACTIVE 06/01/2023   HIV NON-REACTIVE 03/01/2023   Hepatitis B Lab Results  Component Value Date   HEPBSAB REACTIVE (A) 03/05/2020   HEPBSAG NON-REACTIVE 03/05/2020   Hepatitis C Lab Results  Component Value Date   HEPCAB NON-REACTIVE 03/05/2020   Hepatitis A Lab Results  Component Value Date   HAV NON-REACTIVE 03/05/2020   RPR and STI Lab Results  Component Value Date   LABRPR NON-REACTIVE 03/27/2024   LABRPR NON-REACTIVE 11/23/2023   LABRPR NON-REACTIVE 08/24/2023   LABRPR NON-REACTIVE 06/01/2023   LABRPR NON-REACTIVE  03/01/2023    STI Results GC CT  03/27/2024  3:44 PM Negative    Negative    Negative  Negative    Negative    Negative   11/23/2023  3:40 PM Negative    Negative    Negative  Negative    Negative    Negative   08/24/2023  4:19 PM Negative    Negative    Negative  Negative    Negative    Negative   06/01/2023  4:19 PM Negative    Negative    Negative  Negative    Negative    Negative   03/01/2023  4:09 PM Negative  Negative   11/30/2022  4:07 PM Negative    Negative    Negative  Negative    Negative    Negative   10/22/2022 12:54 PM Negative  Negative   10/09/2022  3:13 PM Positive  Negative   09/03/2022 11:42 AM Negative    Negative    Negative  Negative    Negative    Negative   08/08/2022  4:47 PM Positive  Negative   06/02/2022  4:12 PM Negative    Negative    Negative  Negative    Negative    Negative   03/05/2022  3:01 PM Negative    Negative    Negative  Negative    Negative  Negative   09/03/2021 10:10 AM Negative    Negative    Negative  Negative    Negative    Negative   06/04/2021 10:18 AM Positive    Negative    Negative  Negative    Negative    Negative   04/03/2021  2:50 PM Negative    Negative  Negative    Negative   10/02/2020  4:04 PM Negative  Negative   03/05/2020  3:45 PM Negative    Negative  Negative    Negative     Assessment: Martinez comes in today for HIV PrEP follow up. Doing well on Descovy  without any issues or side effects. Burrell it from [facility] who sends on time and without delay. Screened for acute HIV symptoms such as fatigue, muscle aches, rash, sore throat, lymphadenopathy, headache, night sweats, nausea/vomiting/diarrhea, and fever. Denies any symptoms. No concerns. New baby girl is doing well and sleeping through the night. He is enjoying fatherhood.  Routine labs: HIV ab today; last lipid panel and BMP was on 06/01/23 (will order at next visit); no new partners or concerns so politely decline  STI testing today.  Eligible vaccinations: Hepatitis A, HPV, flu, and COVID vaccines. Accepts HPV vaccine and will start the series today.  Plan: - HIV ab today - Descovy  x 3 months if HIV negative - HPV vaccine #1 of 3 today - next one due at appointment in January - Follow up on 09/25/24 with me at 330pm  Juandaniel Manfredo L. Azka Steger, PharmD, BCIDP, AAHIVP, CPP Clinical Pharmacist Practitioner - Infectious Diseases Clinical Pharmacist Lead - Specialty Pharmacy [facility] for Infect[facility] Disease

## 2024-06-26 ENCOUNTER — Other Ambulatory Visit: Payer: Self-pay

## 2024-06-26 ENCOUNTER — Ambulatory Visit: Admitting: Pharmacist

## 2024-06-26 DIAGNOSIS — Z113 Encounter for screening for infections with a predominantly sexual mode of transmission: Secondary | ICD-10-CM | POA: Diagnosis not present

## 2024-06-26 DIAGNOSIS — Z2981 Encounter for HIV pre-exposure prophylaxis: Secondary | ICD-10-CM | POA: Diagnosis not present

## 2024-06-26 DIAGNOSIS — Z23 Encounter for immunization: Secondary | ICD-10-CM

## 2024-06-26 DIAGNOSIS — Z7252 High risk homosexual behavior: Secondary | ICD-10-CM | POA: Diagnosis not present

## 2024-06-26 MED ORDER — EMTRICITABINE-TENOFOVIR AF 200-25 MG PO TABS: 1.0000 | ORAL_TABLET | Freq: Every day | ORAL | 2 refills | Status: DC

## 2024-06-27 LAB — HIV ANTIBODY (ROUTINE TESTING W REFLEX)
HIV 1&2 Ab, 4th Generation: NONREACTIVE
HIV FINAL INTERPRETATION: NEGATIVE

## 2024-09-07 ENCOUNTER — Other Ambulatory Visit: Payer: Self-pay | Admitting: Pharmacist

## 2024-09-07 DIAGNOSIS — Z7252 High risk homosexual behavior: Secondary | ICD-10-CM

## 2024-09-22 NOTE — Progress Notes (Signed)
 "  HPI: Ian Humphrey is a 39 y.o. male who presents to the [facility] pharmacy clinic for HIV PrEP follow-up.  Referring ID Physician: Dr. Overton   There are no active problems to display for this patient.  Patient's Medications  New Prescriptions   No medications on file  Previous Medications   ACYCLOVIR (ZOVIRAX) 400 MG TABLET    Take 400 mg by mouth 5 (five) times daily.   DOXYCYCLINE  (VIBRAMYCIN ) 100 MG CAPSULE    Take 1 capsule (100 mg total) by mouth 2 (two) times daily.   EMTRICITABINE -TENOFOVIR  AF (DESCOVY ) 200-25 MG TABLET    Take 1 tablet by mouth daily.  Modified Medications   No medications on file  Discontinued Medications   No medications on file       03/05/2020    3:33 PM  [facility] HIV PREP FLOWSHEET RESULTS  Insurance Status Uninsured  Gender at birth Male  Gender identity cis-Male  Sex Partners Men only  # sex partners past 3-6 mos 1  Sex activity preferences Insertive and receptive  Condom use No  Treated for STI? Yes  HIV symptoms? Flu-like/Mono-like symptoms  PrEP Eligibility Yes    Labs:  SCr: Lab Results  Component Value Date   CREATININE 1.45 (H) 06/01/2023   CREATININE 1.18 06/02/2022   CREATININE 0.96 06/04/2021   CREATININE 0.98 10/02/2020   CREATININE 1.10 04/08/2020   HIV Lab Results  Component Value Date   HIV NON-REACTIVE 06/26/2024   HIV NON-REACTIVE 03/27/2024   HIV NON-REACTIVE 11/23/2023   HIV NON-REACTIVE 08/24/2023   HIV NON-REACTIVE 06/01/2023   Hepatitis B Lab Results  Component Value Date   HEPBSAB REACTIVE (A) 03/05/2020   HEPBSAG NON-REACTIVE 03/05/2020   Hepatitis C Lab Results  Component Value Date   HEPCAB NON-REACTIVE 03/05/2020   Hepatitis A Lab Results  Component Value Date   HAV NON-REACTIVE 03/05/2020   RPR and STI Lab Results  Component Value Date   LABRPR NON-REACTIVE 03/27/2024   LABRPR NON-REACTIVE 11/23/2023   LABRPR NON-REACTIVE 08/24/2023   LABRPR NON-REACTIVE 06/01/2023   LABRPR NON-REACTIVE  03/01/2023    STI Results GC CT  03/27/2024  3:44 PM Negative    Negative    Negative  Negative    Negative    Negative   11/23/2023  3:40 PM Negative    Negative    Negative  Negative    Negative    Negative   08/24/2023  4:19 PM Negative    Negative    Negative  Negative    Negative    Negative   06/01/2023  4:19 PM Negative    Negative    Negative  Negative    Negative    Negative   03/01/2023  4:09 PM Negative  Negative   11/30/2022  4:07 PM Negative    Negative    Negative  Negative    Negative    Negative   10/22/2022 12:54 PM Negative  Negative   10/09/2022  3:13 PM Positive  Negative   09/03/2022 11:42 AM Negative    Negative    Negative  Negative    Negative    Negative   08/08/2022  4:47 PM Positive  Negative   06/02/2022  4:12 PM Negative    Negative    Negative  Negative    Negative    Negative   03/05/2022  3:01 PM Negative    Negative    Negative  Negative    Negative    Negative  09/03/2021 10:10 AM Negative    Negative    Negative  Negative    Negative    Negative   06/04/2021 10:18 AM Positive    Negative    Negative  Negative    Negative    Negative   04/03/2021  2:50 PM Negative    Negative  Negative    Negative   10/02/2020  4:04 PM Negative  Negative   03/05/2020  3:45 PM Negative    Negative  Negative    Negative     Assessment: Ian Humphrey is here today to follow up for HIV PrEP. Continues to do well on Descovy  without any issues. No insurance changes for 2026. No issues with [facility]. Screened for acute HIV symptoms such as fatigue, muscle aches, rash, sore throat, lymphadenopathy, headache, night sweats, nausea/vomiting/diarrhea, and fever. Denies any symptoms. Just spent his first Christmas with their new baby and also got married in November!  Labs: Last HIV ab was negative on 06/26/24; will repeat today. Due for annual BMP and lipid check and also requesting STI testing.  Eligible vaccinations: Due for  second HPV vaccine today; last one due in April/May.  Plan: - HIV ab, lipid panel, BMP, RPR, and urine/rectal/pharyngeal cytologies for GC/chlamydia - Descovy  x 3 months if HIV negative - HPV vaccine #2 of 3 administered - Follow up on 12/25/24 with me  Cathe Bilger L. Dianne Whelchel, PharmD, BCIDP, AAHIVP, CPP Clinical Pharmacist Practitioner - Infectious Diseases Clinical Pharmacist Lead - Specialty Pharmacy [facility] for [facility]  "

## 2024-09-25 ENCOUNTER — Ambulatory Visit (INDEPENDENT_AMBULATORY_CARE_PROVIDER_SITE_OTHER): Admitting: Pharmacist

## 2024-09-25 ENCOUNTER — Other Ambulatory Visit (HOSPITAL_COMMUNITY)
Admission: RE | Admit: 2024-09-25 | Discharge: 2024-09-25 | Disposition: A | Discharge status: Home / Self Care | Source: Ambulatory Visit | Attending: Infectious Disease | Admitting: Infectious Disease

## 2024-09-25 ENCOUNTER — Other Ambulatory Visit: Payer: Self-pay

## 2024-09-25 DIAGNOSIS — Z113 Encounter for screening for infections with a predominantly sexual mode of transmission: Secondary | ICD-10-CM | POA: Diagnosis present

## 2024-09-25 DIAGNOSIS — Z23 Encounter for immunization: Secondary | ICD-10-CM | POA: Diagnosis not present

## 2024-09-25 DIAGNOSIS — Z2981 Encounter for HIV pre-exposure prophylaxis: Secondary | ICD-10-CM

## 2024-09-25 DIAGNOSIS — Z79899 Other long term (current) drug therapy: Secondary | ICD-10-CM

## 2024-09-26 LAB — CYTOLOGY, (ORAL, ANAL, URETHRAL) ANCILLARY ONLY
Chlamydia: NEGATIVE
Chlamydia: NEGATIVE
Comment: NEGATIVE
Comment: NEGATIVE
Comment: NORMAL
Comment: NORMAL
Neisseria Gonorrhea: NEGATIVE
Neisseria Gonorrhea: NEGATIVE

## 2024-09-26 LAB — URINE CYTOLOGY ANCILLARY ONLY
Chlamydia: NEGATIVE
Comment: NEGATIVE
Comment: NORMAL
Neisseria Gonorrhea: NEGATIVE

## 2024-09-27 ENCOUNTER — Other Ambulatory Visit: Payer: Self-pay | Admitting: Pharmacist

## 2024-09-27 DIAGNOSIS — Z7252 High risk homosexual behavior: Secondary | ICD-10-CM

## 2024-09-27 LAB — HIV ANTIBODY (ROUTINE TESTING W REFLEX)
HIV 1&2 Ab, 4th Generation: NONREACTIVE
HIV FINAL INTERPRETATION: NEGATIVE

## 2024-09-27 LAB — BASIC METABOLIC PANEL WITH GFR
BUN: 12 mg/dL (ref 7–25)
CO2: 28 mmol/L (ref 20–32)
Calcium: 9.1 mg/dL (ref 8.6–10.3)
Chloride: 104 mmol/L (ref 98–110)
Creat: 1.01 mg/dL (ref 0.60–1.29)
Glucose, Bld: 95 mg/dL (ref 65–99)
Potassium: 4 mmol/L (ref 3.5–5.3)
Sodium: 139 mmol/L (ref 135–146)
eGFR: 96 mL/min/1.73m2

## 2024-09-27 LAB — LIPID PANEL
Cholesterol: 129 mg/dL
HDL: 36 mg/dL — ABNORMAL LOW
LDL Cholesterol (Calc): 68 mg/dL
Non-HDL Cholesterol (Calc): 93 mg/dL
Total CHOL/HDL Ratio: 3.6 (calc)
Triglycerides: 174 mg/dL — ABNORMAL HIGH

## 2024-09-27 LAB — SYPHILIS: RPR W/REFLEX TO RPR TITER AND TREPONEMAL ANTIBODIES, TRADITIONAL SCREENING AND DIAGNOSIS ALGORITHM: RPR Ser Ql: NONREACTIVE

## 2024-09-27 MED ORDER — EMTRICITABINE-TENOFOVIR AF 200-25 MG PO TABS: 1.0000 | ORAL_TABLET | Freq: Every day | ORAL | 2 refills | Status: AC

## 2024-12-25 ENCOUNTER — Ambulatory Visit: Payer: Self-pay | Admitting: Pharmacist
